# Patient Record
Sex: Female | Born: 1960 | Race: Black or African American | Hispanic: No | Marital: Single | Smoking: Never smoker
Health system: Southern US, Community
[De-identification: ages and names within clinical notes are randomized; demographics above are authoritative.]

## PROBLEM LIST (undated history)

## (undated) DIAGNOSIS — I10 Essential (primary) hypertension: Secondary | ICD-10-CM

## (undated) DIAGNOSIS — I4891 Unspecified atrial fibrillation: Secondary | ICD-10-CM

## (undated) DIAGNOSIS — N183 Chronic kidney disease, stage 3 unspecified: Secondary | ICD-10-CM

## (undated) HISTORY — PX: ICD IMPLANT: EP1208

## (undated) HISTORY — PX: ABLATION: SHX5711

---

## 2020-12-21 ENCOUNTER — Inpatient Hospital Stay
Admission: EM | Admit: 2020-12-21 | Discharge: 2020-12-23 | DRG: 281 | Disposition: A | Discharge status: Home / Self Care | Payer: BC Managed Care – PPO | Attending: Internal Medicine | Admitting: Internal Medicine

## 2020-12-21 ENCOUNTER — Other Ambulatory Visit: Payer: Self-pay

## 2020-12-21 ENCOUNTER — Encounter: Payer: Self-pay | Admitting: Emergency Medicine

## 2020-12-21 ENCOUNTER — Emergency Department: Payer: BC Managed Care – PPO

## 2020-12-21 DIAGNOSIS — E782 Mixed hyperlipidemia: Secondary | ICD-10-CM | POA: Diagnosis present

## 2020-12-21 DIAGNOSIS — Z95810 Presence of automatic (implantable) cardiac defibrillator: Secondary | ICD-10-CM

## 2020-12-21 DIAGNOSIS — I248 Other forms of acute ischemic heart disease: Secondary | ICD-10-CM | POA: Diagnosis not present

## 2020-12-21 DIAGNOSIS — I4891 Unspecified atrial fibrillation: Secondary | ICD-10-CM | POA: Diagnosis present

## 2020-12-21 DIAGNOSIS — I130 Hypertensive heart and chronic kidney disease with heart failure and stage 1 through stage 4 chronic kidney disease, or unspecified chronic kidney disease: Secondary | ICD-10-CM | POA: Diagnosis present

## 2020-12-21 DIAGNOSIS — I480 Paroxysmal atrial fibrillation: Secondary | ICD-10-CM | POA: Diagnosis present

## 2020-12-21 DIAGNOSIS — I5032 Chronic diastolic (congestive) heart failure: Secondary | ICD-10-CM | POA: Diagnosis present

## 2020-12-21 DIAGNOSIS — N183 Chronic kidney disease, stage 3 unspecified: Secondary | ICD-10-CM

## 2020-12-21 DIAGNOSIS — Z20822 Contact with and (suspected) exposure to covid-19: Secondary | ICD-10-CM | POA: Diagnosis present

## 2020-12-21 DIAGNOSIS — N1831 Chronic kidney disease, stage 3a: Secondary | ICD-10-CM | POA: Diagnosis present

## 2020-12-21 DIAGNOSIS — E876 Hypokalemia: Secondary | ICD-10-CM | POA: Diagnosis not present

## 2020-12-21 DIAGNOSIS — Z7901 Long term (current) use of anticoagulants: Secondary | ICD-10-CM | POA: Diagnosis not present

## 2020-12-21 DIAGNOSIS — R202 Paresthesia of skin: Secondary | ICD-10-CM | POA: Diagnosis present

## 2020-12-21 DIAGNOSIS — I422 Other hypertrophic cardiomyopathy: Secondary | ICD-10-CM | POA: Diagnosis present

## 2020-12-21 DIAGNOSIS — I214 Non-ST elevation (NSTEMI) myocardial infarction: Principal | ICD-10-CM | POA: Diagnosis present

## 2020-12-21 DIAGNOSIS — Z8249 Family history of ischemic heart disease and other diseases of the circulatory system: Secondary | ICD-10-CM

## 2020-12-21 DIAGNOSIS — N179 Acute kidney failure, unspecified: Secondary | ICD-10-CM | POA: Diagnosis present

## 2020-12-21 DIAGNOSIS — I10 Essential (primary) hypertension: Secondary | ICD-10-CM | POA: Diagnosis present

## 2020-12-21 DIAGNOSIS — R778 Other specified abnormalities of plasma proteins: Secondary | ICD-10-CM | POA: Diagnosis not present

## 2020-12-21 HISTORY — DX: Chronic kidney disease, stage 3 unspecified: N18.30

## 2020-12-21 HISTORY — DX: Unspecified atrial fibrillation: I48.91

## 2020-12-21 HISTORY — DX: Essential (primary) hypertension: I10

## 2020-12-21 LAB — BASIC METABOLIC PANEL
Anion gap: 10 (ref 5–15)
BUN: 18 mg/dL (ref 8–23)
CO2: 23 mmol/L (ref 22–32)
Calcium: 10.6 mg/dL — ABNORMAL HIGH (ref 8.9–10.3)
Chloride: 108 mmol/L (ref 98–111)
Creatinine, Ser: 1.55 mg/dL — ABNORMAL HIGH (ref 0.44–1.00)
GFR, Estimated: 38 mL/min — ABNORMAL LOW (ref >60)
Glucose, Bld: 156 mg/dL — ABNORMAL HIGH (ref 70–99)
Potassium: 3.1 mmol/L — ABNORMAL LOW (ref 3.5–5.1)
Sodium: 141 mmol/L (ref 135–145)

## 2020-12-21 LAB — CBC WITH DIFFERENTIAL/PLATELET
Abs Immature Granulocytes: 0 10*3/uL (ref 0.00–0.07)
Basophils Absolute: 0 10*3/uL (ref 0.0–0.1)
Basophils Relative: 1 %
Eosinophils Absolute: 0.2 10*3/uL (ref 0.0–0.5)
Eosinophils Relative: 5 %
HCT: 38.7 % (ref 36.0–46.0)
Hemoglobin: 12.9 g/dL (ref 12.0–15.0)
Immature Granulocytes: 0 %
Lymphocytes Relative: 45 %
Lymphs Abs: 1.7 10*3/uL (ref 0.7–4.0)
MCH: 28.2 pg (ref 26.0–34.0)
MCHC: 33.3 g/dL (ref 30.0–36.0)
MCV: 84.7 fL (ref 80.0–100.0)
Monocytes Absolute: 0.4 10*3/uL (ref 0.1–1.0)
Monocytes Relative: 9 %
Neutro Abs: 1.5 10*3/uL — ABNORMAL LOW (ref 1.7–7.7)
Neutrophils Relative %: 40 %
Platelets: 197 10*3/uL (ref 150–400)
RBC: 4.57 MIL/uL (ref 3.87–5.11)
RDW: 14.7 % (ref 11.5–15.5)
Smear Review: NORMAL
WBC Morphology: ABNORMAL
WBC: 3.7 10*3/uL — ABNORMAL LOW (ref 4.0–10.5)
nRBC: 0 % (ref 0.0–0.2)

## 2020-12-21 LAB — PROTIME-INR
INR: 1.2 (ref 0.8–1.2)
Prothrombin Time: 14.9 seconds (ref 11.4–15.2)

## 2020-12-21 LAB — RESP PANEL BY RT-PCR (FLU A&B, COVID) ARPGX2
Influenza A by PCR: NEGATIVE
Influenza B by PCR: NEGATIVE
SARS Coronavirus 2 by RT PCR: NEGATIVE

## 2020-12-21 LAB — BRAIN NATRIURETIC PEPTIDE: B Natriuretic Peptide: 786.4 pg/mL — ABNORMAL HIGH (ref 0.0–100.0)

## 2020-12-21 LAB — TROPONIN I (HIGH SENSITIVITY)
Troponin I (High Sensitivity): 56 ng/L — ABNORMAL HIGH (ref <18)
Troponin I (High Sensitivity): 764 ng/L (ref <18)

## 2020-12-21 LAB — APTT: aPTT: 22 seconds — ABNORMAL LOW (ref 24–36)

## 2020-12-21 MED ORDER — LABETALOL HCL 5 MG/ML IV SOLN: 10.0000 mg | INTRAVENOUS | Status: DC | PRN

## 2020-12-21 MED ORDER — ASCORBIC ACID 500 MG PO TABS
1000.0000 mg | ORAL_TABLET | Freq: Every day | ORAL | Status: DC
  Administered 2020-12-22 – 2020-12-23 (×2): 1000 mg via ORAL
  Filled 2020-12-21 (×2): qty 2

## 2020-12-21 MED ORDER — ONDANSETRON HCL 4 MG/2ML IJ SOLN
4.0000 mg | Freq: Four times a day (QID) | INTRAMUSCULAR | Status: DC | PRN
Start: 2020-12-21 — End: 2020-12-23

## 2020-12-21 MED ORDER — METOPROLOL SUCCINATE ER 50 MG PO TB24
50.0000 mg | ORAL_TABLET | Freq: Every day | ORAL | Status: DC
  Administered 2020-12-22 – 2020-12-23 (×2): 50 mg via ORAL
  Filled 2020-12-21 (×2): qty 1

## 2020-12-21 MED ORDER — SODIUM CHLORIDE 0.9 % IV SOLN
INTRAVENOUS | Status: DC
  Administered 2020-12-21: 175 mL/h via INTRAVENOUS

## 2020-12-21 MED ORDER — LOSARTAN POTASSIUM-HCTZ 100-12.5 MG PO TABS: 1.0000 | ORAL_TABLET | Freq: Every day | ORAL | Status: DC

## 2020-12-21 MED ORDER — VALACYCLOVIR HCL 500 MG PO TABS
1000.0000 mg | ORAL_TABLET | Freq: Every day | ORAL | Status: DC
  Filled 2020-12-21 (×2): qty 2

## 2020-12-21 MED ORDER — DILTIAZEM HCL 25 MG/5ML IV SOLN
5.0000 mg | Freq: Once | INTRAVENOUS | Status: AC
  Administered 2020-12-21: 5 mg via INTRAVENOUS
  Filled 2020-12-21: qty 5

## 2020-12-21 MED ORDER — SIMVASTATIN 20 MG PO TABS
40.0000 mg | ORAL_TABLET | Freq: Every day | ORAL | Status: DC
  Administered 2020-12-21 – 2020-12-22 (×2): 40 mg via ORAL
  Filled 2020-12-21: qty 2
  Filled 2020-12-21: qty 4

## 2020-12-21 MED ORDER — HEPARIN (PORCINE) 25000 UT/250ML-% IV SOLN
1000.0000 [IU]/h | INTRAVENOUS | Status: DC
  Administered 2020-12-21: 1000 [IU]/h via INTRAVENOUS
  Filled 2020-12-21: qty 250

## 2020-12-21 MED ORDER — LOSARTAN POTASSIUM 50 MG PO TABS
100.0000 mg | ORAL_TABLET | Freq: Every day | ORAL | Status: DC
  Administered 2020-12-21: 100 mg via ORAL
  Filled 2020-12-21: qty 2

## 2020-12-21 MED ORDER — PANTOPRAZOLE SODIUM 40 MG PO TBEC
80.0000 mg | DELAYED_RELEASE_TABLET | Freq: Every morning | ORAL | Status: DC
  Administered 2020-12-22 – 2020-12-23 (×2): 80 mg via ORAL
  Filled 2020-12-21 (×2): qty 2

## 2020-12-21 MED ORDER — DILTIAZEM HCL 25 MG/5ML IV SOLN: 10.0000 mg | Freq: Once | INTRAVENOUS | Status: DC

## 2020-12-21 MED ORDER — HYDROCHLOROTHIAZIDE 12.5 MG PO CAPS
12.5000 mg | ORAL_CAPSULE | Freq: Every day | ORAL | Status: DC
  Administered 2020-12-21: 12.5 mg via ORAL
  Filled 2020-12-21: qty 1

## 2020-12-21 MED ORDER — ACETAMINOPHEN 325 MG PO TABS: 650.0000 mg | ORAL_TABLET | ORAL | Status: DC | PRN

## 2020-12-21 MED ORDER — SODIUM CHLORIDE 0.9 % IV BOLUS
500.0000 mL | Freq: Once | INTRAVENOUS | Status: AC
Start: 2020-12-21 — End: 2020-12-21
  Administered 2020-12-21: 500 mL via INTRAVENOUS

## 2020-12-21 MED ORDER — HEPARIN BOLUS VIA INFUSION
4000.0000 [IU] | Freq: Once | INTRAVENOUS | Status: AC
  Administered 2020-12-21: 4000 [IU] via INTRAVENOUS
  Filled 2020-12-21: qty 4000

## 2020-12-21 MED ORDER — POTASSIUM CHLORIDE 10 MEQ/100ML IV SOLN
10.0000 meq | INTRAVENOUS | Status: AC
  Administered 2020-12-21 (×4): 10 meq via INTRAVENOUS
  Filled 2020-12-21 (×4): qty 100

## 2020-12-21 MED ORDER — COLCHICINE 0.6 MG PO TABS
0.6000 mg | ORAL_TABLET | Freq: Two times a day (BID) | ORAL | Status: DC
  Administered 2020-12-21 – 2020-12-23 (×4): 0.6 mg via ORAL
  Filled 2020-12-21 (×5): qty 1

## 2020-12-21 NOTE — ED Provider Notes (Signed)
[facility] Emergency Department Provider Note ____________________________________________   Event Date/Time   First MD Initiated Contact with Patient 12/21/20 1104     (approximate)  I have reviewed the triage vital signs and the nursing notes.   HISTORY  Chief Complaint Atrial Fibrillation    HPI Marie Nunez is a 60 y.o. female with PMH as noted below including atrial fibrillation on Eliquis (status post ablation earlier this year), hypertension, and ICD placement who presents with generalized weakness, acute onset around 10 AM after she came out of [place], persistent course, and associated with sweating and lightheadedness.  The patient states she feels slightly short of breath.  She denies any chest pain.  She has no nausea or vomiting.  The patient has had episodes of atrial fibrillation in the past with palpitations but is not really feeling any significant palpitations at this time.  She denies eating any large meals in the last few days or drinking alcohol.  She is visiting family here but lives in [place].  Past Medical History:  Diagnosis Date  . Atrial fibrillation (HCC)   . Hypertension     There are no problems to display for this patient.   Past Surgical History:  Procedure Laterality Date  . ABLATION    . ICD IMPLANT      Prior to Admission medications   Not on File    Allergies Ciprofloxacin and Iodine  History reviewed. No pertinent family history.  Social History Social History   Tobacco Use  . Smoking status: Never Smoker  . Smokeless tobacco: Never Used  Vaping Use  . Vaping Use: Never used  Substance Use Topics  . Alcohol use: Yes    Review of Systems  Constitutional: No fever.  Positive for weakness and lightheadedness. Eyes: No redness. ENT: No sore throat. Cardiovascular: Denies chest pain. Respiratory: Denies shortness of breath. Gastrointestinal: No vomiting or diarrhea.  Genitourinary:  Negative for flank pain. Musculoskeletal: Negative for back pain. Skin: Negative for rash. Neurological: Negative for headache.   ____________________________________________   PHYSICAL EXAM:  VITAL SIGNS: ED Triage Vitals  Enc Vitals Group     BP 12/21/20 1117 (!) 103/49     Pulse Rate 12/21/20 1117 (!) 138     Resp 12/21/20 1117 12     Temp 12/21/20 1117 98 F (36.7 C)     Temp Source 12/21/20 1117 Oral     SpO2 12/21/20 1117 100 %     Weight 12/21/20 1118 185 lb (83.9 kg)     Height 12/21/20 1118 5\' 5"  (1.651 m)     Head Circumference --      Peak Flow --      Pain Score 12/21/20 1117 0     Pain Loc --      Pain Edu? --      Excl. in GC? --     Constitutional: Alert and oriented.  Somewhat tired appearing and diaphoretic but in no acute distress. Eyes: Conjunctivae are normal.  Head: Atraumatic. Nose: No congestion/rhinnorhea. Mouth/Throat: Mucous membranes are moist.   Neck: Normal range of motion.  Cardiovascular: Tachycardic, irregular rhythm. Grossly normal heart sounds.  Good peripheral circulation. Respiratory: Normal respiratory effort.  No retractions. Lungs CTAB. Gastrointestinal: No distention.  Musculoskeletal: No lower extremity edema.  Extremities warm and well perfused.  Neurologic:  Normal speech and language. No gross focal neurologic deficits are appreciated.  Skin:  Skin is warm and dry. No rash noted. Psychiatric: Mood and affect are  normal. Speech and behavior are normal.  ____________________________________________   LABS (all labs ordered are listed, but only abnormal results are displayed)  Labs Reviewed  BASIC METABOLIC PANEL - Abnormal; Notable for the following components:      Result Value   Potassium 3.1 (*)    Glucose, Bld 156 (*)    Creatinine, Ser 1.55 (*)    Calcium 10.6 (*)    GFR, Estimated 38 (*)    All other components within normal limits  CBC WITH DIFFERENTIAL/PLATELET - Abnormal; Notable for the following  components:   WBC 3.7 (*)    Neutro Abs 1.5 (*)    All other components within normal limits  BRAIN NATRIURETIC PEPTIDE - Abnormal; Notable for the following components:   B Natriuretic Peptide 786.4 (*)    All other components within normal limits  TROPONIN I (HIGH SENSITIVITY) - Abnormal; Notable for the following components:   Troponin I (High Sensitivity) 56 (*)    All other components within normal limits  TROPONIN I (HIGH SENSITIVITY) - Abnormal; Notable for the following components:   Troponin I (High Sensitivity) 764 (*)    All other components within normal limits   ____________________________________________  EKG  ED ECG REPORT I, Dionne Bucy, the attending physician, personally viewed and interpreted this ECG.  Date: 12/21/2020 EKG Time: 1109 Rate: 150 Rhythm: Atrial fibrillation with RVR QRS Axis: normal Intervals: normal ST/T Wave abnormalities: Nonspecific ST abnormalities likely related Narrative Interpretation: Atrial fibrillation with RVR; no evidence of acute ischemia   ED ECG REPORT I, Dionne Bucy, the attending physician, personally viewed and interpreted this ECG.  Date: 12/21/2020 EKG Time: 1153 Rate: 60 Rhythm: normal sinus rhythm QRS Axis: normal Intervals: normal ST/T Wave abnormalities: ST depression, diffusely Narrative Interpretation: Nonspecific ST abnormalities with no prior EKG available for comparison   ED ECG REPORT I, Dionne Bucy, the attending physician, personally viewed and interpreted this ECG.  Date: 12/21/2020 EKG Time: 1534 Rate: 60 Rhythm: normal sinus rhythm QRS Axis: normal Intervals: normal ST/T Wave abnormalities: ST depressions inferior and lateral Narrative Interpretation: Nonspecific ST abnormalities change from EKG of 1153 today  ____________________________________________  RADIOLOGY  Chest x-ray interpreted by me shows cardiomegaly with no consolidation or  edema  ____________________________________________   PROCEDURES  Procedure(s) performed: No  Procedures  Critical Care performed: Yes  CRITICAL CARE Performed by: Dionne Bucy   Total critical care time: 30 minutes  Critical care time was exclusive of separately billable procedures and treating other patients.  Critical care was necessary to treat or prevent imminent or life-threatening deterioration.  Critical care was time spent personally by me on the following activities: development of treatment plan with patient and/or surrogate as well as nursing, discussions with consultants, evaluation of patient's response to treatment, examination of patient, obtaining history from patient or surrogate, ordering and performing treatments and interventions, ordering and review of laboratory studies, ordering and review of radiographic studies, pulse oximetry and re-evaluation of patient's condition. ____________________________________________   INITIAL IMPRESSION / ASSESSMENT AND PLAN / ED COURSE  Pertinent labs & imaging results that were available during my care of the patient were reviewed by me and considered in my medical decision making (see chart for details).  60 year old female with history of atrial fibrillation, hypertension, and ICD placement presents with acute onset of diaphoresis, generalized weakness, and lightheadedness this morning which has persisted since then.  She denies any chest pain or palpitations.  Per EMS, the patient was hypotensive in the field with a  systolic as low as the 60s.  On exam, the patient is somewhat tired appearing but in no acute distress.  She is alert and oriented.  Initial blood pressure was approximately 100/50, heart rate is in the 150s and EKG is showing atrial fibrillation with RVR.  The physical exam is otherwise unremarkable.  The lungs are clear.  There is no significant peripheral edema.  Overall presentation is consistent  with paroxysmal atrial fibrillation.  I attempted to review past medical records, however the patient follows in [place] and I do not see any of her records available in care everywhere.  She has not been here previously.  Per discussion with the patient, she does not have a diagnosis of CHF.  Given the improved blood pressure, we will treat with IV Cardizem.  I will consider cardioversion if the patient has recurrent hypotension or other signs of instability.  We will obtain basic and cardiac labs, chest x-ray, and continue to monitor closely.  ----------------------------------------- 11:57 AM on 12/21/2020 -----------------------------------------  Patient has converted to normal sinus at a rate of 60.  ----------------------------------------- 3:49 PM on 12/21/2020 -----------------------------------------  Initial troponin was minimally elevated.  I discussed the case with Dr. Ladona Ridgel from cardiology and the initial plan was to likely discharge home.  However, repeat troponin is significantly elevated.  The patient has no chest pain and has been asymptomatic since converting to sinus rhythm.  Her rate has been 60 since she converted.  Repeat EKG now shows ST depressions in the inferior and lateral leads which are changed from the prior EKG.  This is somewhat concerning for ischemia although the patient is asymptomatic.  I consulted Dr. Ladona Ridgel again and he recommends starting the patient on a heparin infusion and agrees with admission.  We will contact hospitalist.    ____________________________________________   FINAL CLINICAL IMPRESSION(S) / ED DIAGNOSES  Final diagnoses:  Paroxysmal atrial fibrillation (HCC)  NSTEMI (non-ST elevated myocardial infarction) (HCC)      NEW MEDICATIONS STARTED DURING THIS VISIT:  New Prescriptions   No medications on file     Note:  This document was prepared using Dragon voice recognition software and may include unintentional dictation  errors.   Dionne Bucy, MD 12/21/20 [phone]

## 2020-12-21 NOTE — Consult Note (Signed)
ANTICOAGULATION CONSULT NOTE - Initial Consult  Pharmacy Consult for heparin Indication: chest pain/ACS  Allergies  Allergen Reactions  . Ciprofloxacin   . Iodine Rash    Patient Measurements: Height: 5\' 5"  (165.1 cm) Weight: 83.9 kg (185 lb) IBW/kg (Calculated) : 57 Heparin Dosing Weight: 75  Vital Signs: Temp: 98 F (36.7 C) (06/05 1117) Temp Source: Oral (06/05 1117) BP: 140/90 (06/05 1515) Pulse Rate: 58 (06/05 1515)  Labs: Recent Labs    12/21/20 1125 12/21/20 1442  HGB 12.9  --   HCT 38.7  --   PLT 197  --   CREATININE 1.55*  --   TROPONINIHS 56* 764*    Estimated Creatinine Clearance: 40.3 mL/min (A) (by C-G formula based on SCr of 1.55 mg/dL (H)).   Medical History: Past Medical History:  Diagnosis Date  . Atrial fibrillation (HCC)   . Hypertension     Medications:  No anticoagulants prior to admission per chart review   Assessment: 60 yo F presenting with weakness and diaphoresis. Pt found to be in afib. Pt had ablation 2 weeks ago. PMH includes ICD and pacemaker placed in 2020, afib, and HTN. Pharmacy has been consulted to dose and monitor heparin for ACS/STEMI.   Hgb 12.9, plt 197, trops 56>>764  Goal of Therapy:  Heparin level 0.3-0.7 units/ml Monitor platelets by anticoagulation protocol: Yes   Plan:  Give 4000 units bolus x 1 Start heparin infusion at 1000 units/hr Check anti-Xa level in 6 hours and daily while on heparin Continue to monitor H&H and platelets  2021, PharmD Pharmacy Resident  12/21/2020 3:59 PM

## 2020-12-21 NOTE — ED Notes (Signed)
This RN at bedside. Pt NSR and HR in 60s. EKG printed. MD made aware.

## 2020-12-21 NOTE — H&P (Signed)
History and Physical   Marie Nunez CBS:[id number] DOB: July 10, 1961 DOA: 12/21/2020  PCP: Dr. Wilford Grist, [facility]ists: Dr. Jannette Fogo, [facility] Patient coming from: [place] via EMS  I have personally briefly reviewed patient's old medical records in [facility] EMR.  Chief Concern: Diaphoresis  HPI: Marie Nunez is a 60 y.o. female with medical history significant for atrial fibrillation s/p atrial ablation on 12/03/20, GERD, hypertension, hyperlipidemia, hypertrophic cardiomyopathy, stage IIIa CKD, presents to the emergency department for chief concerns of diaphoresis.  She reports that she experienced diaphoresis and nausea that started approximately 10:02 AM while outside the parking lot of [place]. She hasn't eaten anything yet. She has not had food today. She has had two sips of water only.  She reports that she felt this way in January 2022, when she was shopping.  She went home and then she called [facility] and was told to go to the ED, where she was monitored in the ED for several hours and sent home.  She states that she was in the ED for approximately 5 hours.  She also reports that the ED provider discussed with her cardiologist at that time and recommended that she be discharged home.  She states that during both times, she denies arm pain, jaw discomfort, neck discomfort, abnormal taste in her mouth.   She endorsed initial left finger tingling and then right sided tingling today while she was in the ED.  She is visiting from [place].   Social history: She lives by herself at home in [place]. She denies tobacco, etoh, recreational drug use. 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  Vaccinations: She has received three doses of COVID-19 vaccination, Moderna.  Family medical history: older brother had a MI at age 12 and father had a MI in his 33s. Younger sister and younger brother had hypertension  and hyperlipidemia  ROS: Constitutional: no weight change, no fever, + diaphoresis ENT/Mouth: no sore throat, no rhinorrhea Eyes: no eye pain, no vision changes Cardiovascular: no chest pain, no dyspnea,  no edema, no palpitations Respiratory: no cough, no sputum, no wheezing Gastrointestinal: + nausea, no vomiting, no diarrhea, no constipation Genitourinary: no urinary incontinence, no dysuria, no hematuria Musculoskeletal: no arthralgias, no myalgias Skin: no skin lesions, no pruritus, Neuro: + weakness, no loss of consciousness, no syncope Psych: no anxiety, no depression, + decrease appetite Heme/Lymph: no bruising, no bleeding  ED Course: Discussed with ED provider, patient requiring hospitalization for NSTEMI.  Initial vitals in the emergency department showed temperature of 98, respiration rate of 17, heart rate 58, blood pressure 140/90, SPO2 of 100% on room air.  Patient was initially in atrial fibrillation with RVR and improved with diltiazem 5 mg IV per EDP.  Patient was started on heparin GTT with bolus, and received normal saline 500 ml bolus.  Assessment/Plan  Principal Problem:   NSTEMI (non-ST elevated myocardial infarction) (HCC) Active Problems:   Essential hypertension   Hyperlipidemia, mixed   CKD (chronic kidney disease) stage 3, GFR 30-59 ml/min (HCC)   AKI (acute kidney injury) (HCC)   NSTEMI- ACS cannot be excluded at this time -Patient has multiple risk factors including hypertension, family genetics -Cardiology, Dr. Ladona Ridgel has been consulted and recommends admission - Status post heparin bolus and GTT per EDP, this will be continued  Acute kidney injury on CKD stage IIIa -Serum creatinine on presentation is 1.55 with a GFR of 38 -Baseline creatinine is 1.1 - 1.37 from care  everywhere in 2022 -Status post normal saline bolus per EDP -Normal saline 125 mL/h, 1 day ordered -Holding home hydrochlorothiazide and losartan at this time -BMP in the  a.m.  Hypertension- resumed metoprolol succinate 50 mg daily -Labetalol 10 mg IV every 2 hours for elevated blood pressure, 2 doses ordered  Hypertrophic cardiomyopathy-outpatient follow-up with cardiology -Resumed metoprolol succinate 50 mg daily  Atrial fibrillation-status post atrial ablation on 12/03/2020 -Holding home Eliquis at this time - Heparin GTT -Resumed metoprolol succinate 50 mg daily  Hyperlipidemia-simvastatin 40 mg nightly resumed  History of myocarditis/pericarditis-resumed colchicine 0.6 mg p.o. twice daily  Chart reviewed.   Complete echocardiogram on 09/08/2020: Left ventricular ejection fraction was estimated at 55%, left ventricular chamber is normal size, concentric left ventricular hypertrophy with a severely thickened septal wall and mildly thickened posterior wall, severe hypertrophy mid to apex.  Grade 1 diastolic dysfunction.  Estimated pulmonary arterial systolic pressure is 24 mmHg.  DVT prophylaxis: heparin gtt  Code Status: full code  Diet: N.p.o. Family Communication: Neva Seat, older brother was updated at bedside Disposition Plan: Pending clinical course and cardiology evaluation Consults called: Cardiology via EDP Admission status: Inpatient, progressive cardiac, telemetry ordered for 24 hours  Past Medical History:  Diagnosis Date  . Atrial fibrillation (HCC)   . CKD (chronic kidney disease), stage III (HCC)   . Hypertension    Past Surgical History:  Procedure Laterality Date  . ABLATION    . ICD IMPLANT     Social History:  reports that she has never smoked. She has never used smokeless tobacco. She reports current alcohol use. No history on file for drug use.  Allergies  Allergen Reactions  . Ciprofloxacin   . Codeine   . Tape     Pt gets like a burn from the adhesive  . Iodine Rash   Family History  Problem Relation Age of Onset  . Heart disease Father   . Heart attack Brother    Family history: Family history reviewed  and pertinent for MI in father in his 42s, and MI in brother in his 37s.  Hypertension and high cholesterol in younger brother and sister.  Prior home medication: Apixaban 5 mg p.o. twice daily, vitamin C, colchicine 0.6 mg p.o. twice daily, vitamin B-12, vitamin D, esomeprazole 40 mg p.o. twice daily, losartan-hydrochlorothiazide 100-12.5 mg daily, metoprolol succinate 50 mg daily, simvastatin 40 mg p.o. daily, valacyclovir 1 g tablet with no dose or frequency record  Physical Exam: Vitals:   12/21/20 1615 12/21/20 1645 12/21/20 1700 12/21/20 1830  BP: 133/87 (!) 131/96 116/87   Pulse:  60 (!) 58 60  Resp: 17 15 14 17   Temp:      TempSrc:      SpO2: 100% 100% 99% 100%  Weight:      Height:       Constitutional: appears age-appropriate, NAD, calm, comfortable Eyes: PERRL, lids and conjunctivae normal ENMT: Mucous membranes are moist. Posterior pharynx clear of any exudate or lesions. Age-appropriate dentition. Hearing appropriate Neck: normal, supple, no masses, no thyromegaly Respiratory: clear to auscultation bilaterally, no wheezing, no crackles. Normal respiratory effort. No accessory muscle use.  Cardiovascular: Regular rate and rhythm, no murmurs / rubs / gallops. No extremity edema. 2+ pedal pulses. No carotid bruits.  Abdomen: no tenderness, no masses palpated, no hepatosplenomegaly. Bowel sounds positive.  Musculoskeletal: no clubbing / cyanosis. No joint deformity upper and lower extremities. Good ROM, no contractures, no atrophy. Normal muscle tone.  Skin: no rashes, lesions,  ulcers. No induration Neurologic: Sensation intact. Strength 5/5 in all 4.  Psychiatric: Normal judgment and insight. Alert and oriented x 3. Normal mood.   EKG: independently reviewed, showing issue EKG showed atrial fibrillation with rate of 150, QTc 493.  Repeat EKG showed ST depression in leads II, 3, V4 to V5  Chest x-ray on Admission: I personally reviewed and I agree with radiologist reading as  below.  DG Chest Portable 1 View  Result Date: 12/21/2020 CLINICAL DATA:  Atrial fibrillation EXAM: PORTABLE CHEST 1 VIEW COMPARISON:  None. FINDINGS: Lungs are clear. Heart is enlarged with pulmonary vascularity normal. Pacemaker leads are attached to the right atrium and right ventricle. No adenopathy. No pneumothorax. No bone lesions. IMPRESSION: Cardiomegaly with pacemaker leads attached to right atrium and right ventricle. Lungs clear. Electronically Signed   By: Bretta Bang III M.D.   On: 12/21/2020 11:35   Labs on Admission: I have personally reviewed following labs  CBC: Recent Labs  Lab 12/21/20 1125  WBC 3.7*  NEUTROABS 1.5*  HGB 12.9  HCT 38.7  MCV 84.7  PLT 197   Basic Metabolic Panel: Recent Labs  Lab 12/21/20 1125  NA 141  K 3.1*  CL 108  CO2 23  GLUCOSE 156*  BUN 18  CREATININE 1.55*  CALCIUM 10.6*   GFR: Estimated Creatinine Clearance: 40.3 mL/min (A) (by C-G formula based on SCr of 1.55 mg/dL (H)).  Coagulation Profile: Recent Labs  Lab 12/21/20 1623  INR 1.2   Sharnetta Gielow N Ginnette Gates D.O. [facility]  If 7PM-7AM, please contact overnight-coverage provider If 7AM-7PM, please contact day coverage provider [email or web address]  12/21/2020, 7:13 PM

## 2020-12-21 NOTE — ED Triage Notes (Addendum)
Pt via EMS from home. Pt states all of sudden she felt weak and became diaphoretic. Pt had an ablation 2 weeks ago. Pt does have and ICD and pacemaker implanted in 2020. Denies pain. EMS reports initial BP of 60s. Repeat was 75/30. Pt is A&Ox4 and NAD.

## 2020-12-21 NOTE — ED Notes (Signed)
Critical value called from lab - Troponin 764. MD Siadecki made aware

## 2020-12-22 ENCOUNTER — Inpatient Hospital Stay (HOSPITAL_COMMUNITY)
Admit: 2020-12-22 | Discharge: 2020-12-22 | Disposition: A | Discharge status: Home / Self Care | Payer: BC Managed Care – PPO | Attending: Physician Assistant | Admitting: Physician Assistant

## 2020-12-22 DIAGNOSIS — I480 Paroxysmal atrial fibrillation: Secondary | ICD-10-CM

## 2020-12-22 DIAGNOSIS — I422 Other hypertrophic cardiomyopathy: Secondary | ICD-10-CM

## 2020-12-22 DIAGNOSIS — I248 Other forms of acute ischemic heart disease: Secondary | ICD-10-CM

## 2020-12-22 DIAGNOSIS — R778 Other specified abnormalities of plasma proteins: Secondary | ICD-10-CM

## 2020-12-22 DIAGNOSIS — I4891 Unspecified atrial fibrillation: Secondary | ICD-10-CM | POA: Diagnosis present

## 2020-12-22 DIAGNOSIS — I214 Non-ST elevation (NSTEMI) myocardial infarction: Secondary | ICD-10-CM

## 2020-12-22 LAB — PROTIME-INR
INR: 1.1 (ref 0.8–1.2)
Prothrombin Time: 14.3 seconds (ref 11.4–15.2)

## 2020-12-22 LAB — BASIC METABOLIC PANEL
Anion gap: 7 (ref 5–15)
BUN: 14 mg/dL (ref 8–23)
CO2: 25 mmol/L (ref 22–32)
Calcium: 10.6 mg/dL — ABNORMAL HIGH (ref 8.9–10.3)
Chloride: 108 mmol/L (ref 98–111)
Creatinine, Ser: 1.15 mg/dL — ABNORMAL HIGH (ref 0.44–1.00)
GFR, Estimated: 54 mL/min — ABNORMAL LOW (ref >60)
Glucose, Bld: 92 mg/dL (ref 70–99)
Potassium: 3.3 mmol/L — ABNORMAL LOW (ref 3.5–5.1)
Sodium: 140 mmol/L (ref 135–145)

## 2020-12-22 LAB — ECHOCARDIOGRAM COMPLETE
AR max vel: 2.88 cm2
AV Area VTI: 3.13 cm2
AV Area mean vel: 2.8 cm2
AV Mean grad: 5 mmHg
AV Peak grad: 8.8 mmHg
Ao pk vel: 1.48 m/s
Area-P 1/2: 4.68 cm2
Height: 65 in
S' Lateral: 1.93 cm
Weight: 2960 oz

## 2020-12-22 LAB — TROPONIN I (HIGH SENSITIVITY)
Troponin I (High Sensitivity): 1458 ng/L (ref <18)
Troponin I (High Sensitivity): 1288 ng/L (ref <18)

## 2020-12-22 LAB — CBC
HCT: 35.1 % — ABNORMAL LOW (ref 36.0–46.0)
Hemoglobin: 11.6 g/dL — ABNORMAL LOW (ref 12.0–15.0)
MCH: 28.2 pg (ref 26.0–34.0)
MCHC: 33 g/dL (ref 30.0–36.0)
MCV: 85.4 fL (ref 80.0–100.0)
Platelets: 170 10*3/uL (ref 150–400)
RBC: 4.11 MIL/uL (ref 3.87–5.11)
RDW: 14.7 % (ref 11.5–15.5)
WBC: 4.7 10*3/uL (ref 4.0–10.5)
nRBC: 0 % (ref 0.0–0.2)

## 2020-12-22 LAB — HEPARIN LEVEL (UNFRACTIONATED)
Heparin Unfractionated: >1.1 IU/mL — ABNORMAL HIGH (ref 0.30–0.70)
Heparin Unfractionated: 0.98 IU/mL — ABNORMAL HIGH (ref 0.30–0.70)

## 2020-12-22 LAB — APTT
aPTT: 69 seconds — ABNORMAL HIGH (ref 24–36)
aPTT: 60 seconds — ABNORMAL HIGH (ref 24–36)

## 2020-12-22 MED ORDER — HEPARIN (PORCINE) 25000 UT/250ML-% IV SOLN
900.0000 [IU]/h | INTRAVENOUS | Status: DC
  Administered 2020-12-22: 800 [IU]/h via INTRAVENOUS

## 2020-12-22 MED ORDER — HYDRALAZINE HCL 20 MG/ML IJ SOLN: 10.0000 mg | INTRAMUSCULAR | Status: DC | PRN

## 2020-12-22 MED ORDER — LOSARTAN POTASSIUM-HCTZ 100-12.5 MG PO TABS: 1.0000 | ORAL_TABLET | Freq: Every day | ORAL | Status: DC

## 2020-12-22 MED ORDER — HYDROCHLOROTHIAZIDE 12.5 MG PO CAPS
12.5000 mg | ORAL_CAPSULE | Freq: Every day | ORAL | Status: DC
  Administered 2020-12-22 – 2020-12-23 (×2): 12.5 mg via ORAL
  Filled 2020-12-22 (×2): qty 1

## 2020-12-22 MED ORDER — POTASSIUM CHLORIDE CRYS ER 20 MEQ PO TBCR
40.0000 meq | EXTENDED_RELEASE_TABLET | Freq: Two times a day (BID) | ORAL | Status: AC
  Administered 2020-12-22 (×2): 40 meq via ORAL
  Filled 2020-12-22 (×2): qty 2

## 2020-12-22 MED ORDER — LOSARTAN POTASSIUM 50 MG PO TABS
100.0000 mg | ORAL_TABLET | Freq: Every day | ORAL | Status: DC
  Administered 2020-12-22 – 2020-12-23 (×2): 100 mg via ORAL
  Filled 2020-12-22 (×2): qty 2

## 2020-12-22 MED ORDER — APIXABAN 5 MG PO TABS
5.0000 mg | ORAL_TABLET | Freq: Two times a day (BID) | ORAL | Status: DC
  Administered 2020-12-22 – 2020-12-23 (×3): 5 mg via ORAL
  Filled 2020-12-22 (×3): qty 1

## 2020-12-22 MED ORDER — VALACYCLOVIR HCL 500 MG PO TABS
1000.0000 mg | ORAL_TABLET | ORAL | Status: DC | PRN
  Filled 2020-12-22: qty 2

## 2020-12-22 NOTE — ED Notes (Signed)
Sent msg to Dr Georgeann Oppenheim to clarify BP parameters for IV labetalol PRN. Informed of recent BP trends.

## 2020-12-22 NOTE — Progress Notes (Signed)
*  PRELIMINARY RESULTS* Echocardiogram 2D Echocardiogram has been performed.  Cristela Blue 12/22/2020, 11:43 AM

## 2020-12-22 NOTE — ED Notes (Signed)
Spoke with attending and PA from cardiology team. Pt will have echocardiogram first before deciding whether to do cardiac cath. If pt does have cardiac cath, it would be tomorrow. Therefore, pt is not NPO for now. Explained to pt and provided breakfast tray.

## 2020-12-22 NOTE — ED Notes (Signed)
Pt ambulatory to the restroom with no assistance.  

## 2020-12-22 NOTE — Consult Note (Signed)
ANTICOAGULATION CONSULT NOTE   Pharmacy Consult for heparin Indication: chest pain/ACS  Allergies  Allergen Reactions  . Ciprofloxacin   . Codeine   . Tape     Pt gets like a burn from the adhesive  . Iodine Rash    Patient Measurements: Height: 5\' 5"  (165.1 cm) Weight: 83.9 kg (185 lb) IBW/kg (Calculated) : 57 Heparin Dosing Weight: 75  Vital Signs: BP: 163/108 (06/06 0105) Pulse Rate: 64 (06/06 0105)  Labs: Recent Labs    12/21/20 1125 12/21/20 1442 12/21/20 1623 12/22/20 0104  HGB 12.9  --   --   --   HCT 38.7  --   --   --   PLT 197  --   --   --   APTT  --   --  22*  --   LABPROT  --   --  14.9  --   INR  --   --  1.2  --   HEPARINUNFRC  --   --   --  >1.10*  CREATININE 1.55*  --   --   --   TROPONINIHS 56* 764*  --   --     Estimated Creatinine Clearance: 40.3 mL/min (A) (by C-G formula based on SCr of 1.55 mg/dL (H)).   Medical History: Past Medical History:  Diagnosis Date  . Atrial fibrillation (HCC)   . CKD (chronic kidney disease), stage III (HCC)   . Hypertension     Medications:  No anticoagulants prior to admission per chart review   Assessment: 60 yo F presenting with weakness and diaphoresis. Pt found to be in afib. Pt had ablation 2 weeks ago. PMH includes ICD and pacemaker placed in 2020, afib, and HTN. Pharmacy has been consulted to dose and monitor heparin for ACS/STEMI.   Hgb 12.9, plt 197, trops 56>>764  Goal of Therapy:  Heparin level 0.3-0.7 units/ml Monitor platelets by anticoagulation protocol: Yes   6/05 0104 HL >1.10, supratherapeutic   Plan:  Contacted RN to hold infusion for 1 hour. Will restart heparin infusion at rate of 800 units/hr Recheck HL 6 hrs after restart CBC daily while on heparin.  8/05, PharmD, MBA 12/22/2020 2:21 AM

## 2020-12-22 NOTE — Progress Notes (Signed)
PROGRESS NOTE    Marie Nunez  INO:[id number] DOB: Feb 13, 1961 DOA: 12/21/2020 PCP: Pcp, No    Brief Narrative:  60 y.o. female with medical history significant for atrial fibrillation s/p atrial ablation on 12/03/20, GERD, hypertension, hyperlipidemia, hypertrophic cardiomyopathy, stage IIIa CKD, presents to the emergency department for chief concerns of diaphoresis.  She reports that she experienced diaphoresis and nausea that started approximately 10:02 AM while outside the parking lot of [employer]. She hasn't eaten anything yet. She has not had food today. She has had two sips of water only.  She reports that she felt this way in January 2022, when she was shopping.  She went home and then she called [facility] and was told to go to the ED, where she was monitored in the ED for several hours and sent home.  She states that she was in the ED for approximately 5 hours.  She also reports that the ED provider discussed with her cardiologist at that time and recommended that she be discharged home.  She states that during both times, she denies arm pain, jaw discomfort, neck discomfort, abnormal taste in her mouth.   She endorsed initial left finger tingling and then right sided tingling today while she was in the ED.  Troponins were elevated to 700 and subsequently 1400.  Cardiology was consulted after initiation of heparin GTT for presumed NSTEMI.  Case discussed with cardiology.  EKG and echocardiogram reviewed.  Initially was in atrial fibrillation however rate controlled after IV Cardizem push.  Elevated troponin likely some demand ischemia.  No plans for ischemic evaluation during this admission.   Assessment & Plan:   Principal Problem:   NSTEMI (non-ST elevated myocardial infarction) (HCC) Active Problems:   Essential hypertension   Hyperlipidemia, mixed   CKD (chronic kidney disease) stage 3, GFR 30-59 ml/min (HCC)   AKI (acute kidney injury) (HCC)  Atrial fibrillation  with rapid ventricular response Patient status post atrial ablation 5/18 Presented after palpitations and diaphoresis while at [employer] [place] Converted to sinus rhythm after 10 mg push of IV Cardizem Per cardiology not indicative of ACS Plan: Can DC heparin GTT Resume home Eliquis 5 mg twice daily Resume Toprol-XL 50 mg daily Monitor on telemetry overnight for recurrence of arrhythmia If patient remained stable anticipate discharge home 6/7  Elevated troponin Per cardiology not representative of ACS Likely supply demand ischemia in the setting of apical hypertrophy and A. fib RVR Similar presentation in [place] in 2020 Clean cath at that time No plans for ischemic evaluation during this admission Plan: Monitor on telemetry overnight Anticipated discharge in a.m.  Acute kidney injury on chronic disease stage IIIa Creatinine on presentation 1.55 Baseline is 1.1-1.37 Received fluid resuscitation in ED  Hypertension resumed metoprolol succinate 50 mg daily Resume lisinopril hydrochlorothiazide As needed IV hydralazine  Hypertrophic cardiomyopathy outpatient follow-up with cardiology -Resumed metoprolol succinate 50 mg daily  Hyperlipidemia simvastatin 40 mg nightly resumed  History of myocarditis/pericarditis resumed colchicine 0.6 mg p.o. twice daily   DVT prophylaxis: Apixaban Code Status: Full Family Communication: None today Disposition Plan: Status is: Inpatient  Remains inpatient appropriate because:Inpatient level of care appropriate due to severity of illness   Dispo: The patient is from: Home              Anticipated d/c is to: Home              Patient currently is not medically stable to d/c.   Difficult to place patient No  A. fib  with RVR.  Elevated troponin felt to be due to supply demand ischemia.  No plans for inpatient ischemic evaluation.  Will monitor on telemetry with plan discharge on 6/7     Level of care: Progressive  Cardiac  Consultants:   Cardiology-[facility]  Procedures:   None  Antimicrobials:   None   Subjective: Seen and examined.  Resting comfortably in bed.  No visible distress.  No pain complaints  Objective: Vitals:   12/22/20 1030 12/22/20 1100 12/22/20 1130 12/22/20 1230  BP: (!) 139/100  133/71 (!) 136/103  Pulse: 80 62 65 (!) 59  Resp: 16 13 17 15   Temp:      TempSrc:      SpO2: 99% 99% 100% 97%  Weight:      Height:       No intake or output data in the 24 hours ending 12/22/20 1339 Filed Weights   12/21/20 1118  Weight: 83.9 kg    Examination:  General exam: Appears calm and comfortable  Respiratory system: Clear to auscultation. Respiratory effort normal. Cardiovascular system: S1 & S2 heard, RRR. No JVD, murmurs, rubs, gallops or clicks. No pedal edema. Gastrointestinal system: Abdomen is nondistended, soft and nontender. No organomegaly or masses felt. Normal bowel sounds heard. Central nervous system: Alert and oriented. No focal neurological deficits. Extremities: Symmetric 5 x 5 power. Skin: No rashes, lesions or ulcers Psychiatry: Judgement and insight appear normal. Mood & affect appropriate.     Data Reviewed: I have personally reviewed following labs and imaging studies  CBC: Recent Labs  Lab 12/21/20 1125 12/22/20 0402  WBC 3.7* 4.7  NEUTROABS 1.5*  --   HGB 12.9 11.6*  HCT 38.7 35.1*  MCV 84.7 85.4  PLT 197 170   Basic Metabolic Panel: Recent Labs  Lab 12/21/20 1125 12/22/20 0950  NA 141 140  K 3.1* 3.3*  CL 108 108  CO2 23 25  GLUCOSE 156* 92  BUN 18 14  CREATININE 1.55* 1.15*  CALCIUM 10.6* 10.6*   GFR: Estimated Creatinine Clearance: 54.3 mL/min (A) (by C-G formula based on SCr of 1.15 mg/dL (H)). Liver Function Tests: No results for input(s): AST, ALT, ALKPHOS, BILITOT, PROT, ALBUMIN in the last 168 hours. No results for input(s): LIPASE, AMYLASE in the last 168 hours. No results for input(s): AMMONIA in the last 168  hours. Coagulation Profile: Recent Labs  Lab 12/21/20 1623 12/22/20 0402  INR 1.2 1.1   Cardiac Enzymes: No results for input(s): CKTOTAL, CKMB, CKMBINDEX, TROPONINI in the last 168 hours. BNP (last 3 results) No results for input(s): PROBNP in the last 8760 hours. HbA1C: No results for input(s): HGBA1C in the last 72 hours. CBG: No results for input(s): GLUCAP in the last 168 hours. Lipid Profile: No results for input(s): CHOL, HDL, LDLCALC, TRIG, CHOLHDL, LDLDIRECT in the last 72 hours. Thyroid Function Tests: No results for input(s): TSH, T4TOTAL, FREET4, T3FREE, THYROIDAB in the last 72 hours. Anemia Panel: No results for input(s): VITAMINB12, FOLATE, FERRITIN, TIBC, IRON, RETICCTPCT in the last 72 hours. Sepsis Labs: No results for input(s): PROCALCITON, LATICACIDVEN in the last 168 hours.  Recent Results (from the past 240 hour(s))  Resp Panel by RT-PCR (Flu A&B, Covid) Nasopharyngeal Swab     Status: None   Collection Time: 12/21/20  9:49 PM   Specimen: Nasopharyngeal Swab; Nasopharyngeal(NP) swabs in vial transport medium  Result Value Ref Range Status   SARS Coronavirus 2 by RT PCR NEGATIVE NEGATIVE Final    Comment: (NOTE) SARS-CoV-2  target nucleic acids are NOT DETECTED.  The SARS-CoV-2 RNA is generally detectable in upper respiratory specimens during the acute phase of infection. The lowest concentration of SARS-CoV-2 viral copies this assay can detect is 138 copies/mL. A negative result does not preclude SARS-Cov-2 infection and should not be used as the sole basis for treatment or other patient management decisions. A negative result may occur with  improper specimen collection/handling, submission of specimen other than nasopharyngeal swab, presence of viral mutation(s) within the areas targeted by this assay, and inadequate number of viral copies(<138 copies/mL). A negative result must be combined with clinical observations, patient history, and  epidemiological information. The expected result is Negative.  Fact Sheet for Patients:  BloggerCourse.com  Fact Sheet for Healthcare Providers:  SeriousBroker.it  This test is no t yet approved or cleared by the [place] FDA and  has been authorized for detection and/or diagnosis of SARS-CoV-2 by FDA under an Emergency Use Authorization (EUA). This EUA will remain  in effect (meaning this test can be used) for the duration of the COVID-19 declaration under Section 564(b)(1) of the Act, 21 U.S.C.section 360bbb-3(b)(1), unless the authorization is terminated  or revoked sooner.       Influenza A by PCR NEGATIVE NEGATIVE Final   Influenza B by PCR NEGATIVE NEGATIVE Final    Comment: (NOTE) The Xpert Xpress SARS-CoV-2/FLU/RSV plus assay is intended as an aid in the diagnosis of influenza from Nasopharyngeal swab specimens and should not be used as a sole basis for treatment. Nasal washings and aspirates are unacceptable for Xpert Xpress SARS-CoV-2/FLU/RSV testing.  Fact Sheet for Patients: BloggerCourse.com  Fact Sheet for Healthcare Providers: SeriousBroker.it  This test is not yet approved or cleared by the [place] FDA and has been authorized for detection and/or diagnosis of SARS-CoV-2 by FDA under an Emergency Use Authorization (EUA). This EUA will remain in effect (meaning this test can be used) for the duration of the COVID-19 declaration under Section 564(b)(1) of the Act, 21 U.S.C. section 360bbb-3(b)(1), unless the authorization is terminated or revoked.  Performed at [facility], [address], [place], [place] [address]          Radiology Studies: DG Chest Portable 1 View  Result Date: 12/21/2020 CLINICAL DATA:  Atrial fibrillation EXAM: PORTABLE CHEST 1 VIEW COMPARISON:  None. FINDINGS: Lungs are clear. Heart is enlarged with pulmonary  vascularity normal. Pacemaker leads are attached to the right atrium and right ventricle. No adenopathy. No pneumothorax. No bone lesions. IMPRESSION: Cardiomegaly with pacemaker leads attached to right atrium and right ventricle. Lungs clear. Electronically Signed   By: Bretta Bang III M.D.   On: 12/21/2020 11:35   ECHOCARDIOGRAM COMPLETE  Result Date: 12/22/2020    ECHOCARDIOGRAM REPORT   Patient Name:   Marie Nunez Date of Exam: 12/22/2020 Medical Rec #:  [id number]     Height:       65.0 in Accession #:    [id number]    Weight:       185.0 lb Date of Birth:  Dec 13, 1960      BSA:          1.914 m Patient Age:    60 years      BP:           144/93 mmHg Patient Gender: F             HR:           645 bpm. Exam Location:  [facility] Procedure: 2D  Echo, Cardiac Doppler and Color Doppler Indications:     Elevated Troponin  History:         Patient has no prior history of Echocardiogram examinations.                  Defibrillator, Arrythmias:Atrial Fibrillation; Risk                  Factors:Hypertension.  Sonographer:     Cristela Blue RDCS (AE) Referring Phys:  [id number] Lennon Alstrom Diagnosing Phys: Lorine Bears MD  Sonographer Comments: Suboptimal apical window. IMPRESSIONS  1. Left ventricular ejection fraction, by estimation, is 65 to 70%. The left ventricle has normal function. The left ventricle has no regional wall motion abnormalities. There is moderate left ventricular hypertrophy. Left ventricular diastolic parameters are indeterminate.  2. Right ventricular systolic function is normal. The right ventricular size is normal. Tricuspid regurgitation signal is inadequate for assessing PA pressure.  3. Left atrial size was moderately dilated.  4. The mitral valve is normal in structure. Trivial mitral valve regurgitation. No evidence of mitral stenosis.  5. The aortic valve is normal in structure. Aortic valve regurgitation is not visualized. No aortic stenosis is present.  6. Prominent apical hypertrophy.  FINDINGS  Left Ventricle: Left ventricular ejection fraction, by estimation, is 65 to 70%. The left ventricle has normal function. The left ventricle has no regional wall motion abnormalities. The left ventricular internal cavity size was normal in size. There is  moderate left ventricular hypertrophy. Left ventricular diastolic parameters are indeterminate. Right Ventricle: The right ventricular size is normal. No increase in right ventricular wall thickness. Right ventricular systolic function is normal. Tricuspid regurgitation signal is inadequate for assessing PA pressure. Left Atrium: Left atrial size was moderately dilated. Right Atrium: Right atrial size was normal in size. Pericardium: There is no evidence of pericardial effusion. Mitral Valve: The mitral valve is normal in structure. Trivial mitral valve regurgitation. No evidence of mitral valve stenosis. Tricuspid Valve: The tricuspid valve is normal in structure. Tricuspid valve regurgitation is trivial. No evidence of tricuspid stenosis. Aortic Valve: The aortic valve is normal in structure. Aortic valve regurgitation is not visualized. No aortic stenosis is present. Aortic valve mean gradient measures 5.0 mmHg. Aortic valve peak gradient measures 8.8 mmHg. Aortic valve area, by VTI measures 3.13 cm. Pulmonic Valve: The pulmonic valve was normal in structure. Pulmonic valve regurgitation is not visualized. No evidence of pulmonic stenosis. Aorta: The aortic root is normal in size and structure. Venous: The inferior vena cava was not well visualized. IAS/Shunts: No atrial level shunt detected by color flow Doppler. Additional Comments: A device lead is visualized.  LEFT VENTRICLE PLAX 2D LVIDd:         4.16 cm  Diastology LVIDs:         1.93 cm  LV e' medial:    4.68 cm/s LV PW:         1.42 cm  LV E/e' medial:  10.4 LV IVS:        1.22 cm  LV e' lateral:   6.31 cm/s LVOT diam:     2.20 cm  LV E/e' lateral: 7.7 LV SV:         74 LV SV Index:   39 LVOT  Area:     3.80 cm  RIGHT VENTRICLE RV Basal diam:  3.63 cm LEFT ATRIUM             Index  RIGHT ATRIUM           Index LA diam:        4.90 cm 2.56 cm/m  RA Area:     17.10 cm LA Vol (A2C):   65.6 ml 34.28 ml/m RA Volume:   47.40 ml  24.77 ml/m LA Vol (A4C):   95.6 ml 49.96 ml/m LA Biplane Vol: 87.1 ml 45.51 ml/m  AORTIC VALVE                    PULMONIC VALVE AV Area (Vmax):    2.88 cm     PV Vmax:        0.80 m/s AV Area (Vmean):   2.80 cm     PV Peak grad:   2.5 mmHg AV Area (VTI):     3.13 cm     RVOT Peak grad: 5 mmHg AV Vmax:           148.00 cm/s AV Vmean:          102.000 cm/s AV VTI:            0.237 m AV Peak Grad:      8.8 mmHg AV Mean Grad:      5.0 mmHg LVOT Vmax:         112.00 cm/s LVOT Vmean:        75.000 cm/s LVOT VTI:          0.195 m LVOT/AV VTI ratio: 0.82  AORTA Ao Root diam: 3.00 cm MITRAL VALVE               TRICUSPID VALVE MV Area (PHT): 4.68 cm    TR Peak grad:   16.8 mmHg MV Decel Time: 162 msec    TR Vmax:        205.00 cm/s MV E velocity: 48.80 cm/s MV A velocity: 48.40 cm/s  SHUNTS MV E/A ratio:  1.01        Systemic VTI:  0.20 m                            Systemic Diam: 2.20 cm Lorine Bears MD Electronically signed by Lorine Bears MD Signature Date/Time: 12/22/2020/12:19:20 PM    Final         Scheduled Meds: . apixaban  5 mg Oral BID  . ascorbic acid  1,000 mg Oral Daily  . colchicine  0.6 mg Oral BID  . metoprolol succinate  50 mg Oral Daily  . pantoprazole  80 mg Oral q AM  . potassium chloride  40 mEq Oral BID  . simvastatin  40 mg Oral QHS   Continuous Infusions:   LOS: 1 day    Time spent: 25 minutes    Tresa Moore, MD [facility] Pager [phone]  If 7PM-7AM, please contact night-coverage 12/22/2020, 1:39 PM

## 2020-12-22 NOTE — ED Notes (Signed)
Pt walked to toilet with steady gait. Pt back in bed.

## 2020-12-22 NOTE — ED Notes (Signed)
Called pharmacy re: apixaban dose. It is not verified yet, they said to keep heparin running until apixaban is verified and available.

## 2020-12-22 NOTE — Consult Note (Signed)
ANTICOAGULATION CONSULT NOTE   Pharmacy Consult for heparin Indication: chest pain/ACS  Allergies  Allergen Reactions  . Ciprofloxacin   . Codeine   . Tape     Pt gets like a burn from the adhesive  . Iodine Rash    Patient Measurements: Height: 5\' 5"  (165.1 cm) Weight: 83.9 kg (185 lb) IBW/kg (Calculated) : 57 Heparin Dosing Weight: 75  Vital Signs: BP: 144/93 (06/06 0930) Pulse Rate: 64 (06/06 0930)  Labs: Recent Labs    12/21/20 1125 12/21/20 1442 12/21/20 1623 12/22/20 0104 12/22/20 0402 12/22/20 0747 12/22/20 0950  HGB 12.9  --   --   --  11.6*  --   --   HCT 38.7  --   --   --  35.1*  --   --   PLT 197  --   --   --  170  --   --   APTT  --   --  22*  --  69*  --  60*  LABPROT  --   --  14.9  --  14.3  --   --   INR  --   --  1.2  --  1.1  --   --   HEPARINUNFRC  --   --   --  >1.10*  --   --  0.98*  CREATININE 1.55*  --   --   --   --   --  1.15*  TROPONINIHS 56* 764*  --   --   --  1,458*  --     Estimated Creatinine Clearance: 54.3 mL/min (A) (by C-G formula based on SCr of 1.15 mg/dL (H)).   Medical History: Past Medical History:  Diagnosis Date  . Atrial fibrillation (HCC)   . CKD (chronic kidney disease), stage III (HCC)   . Hypertension     Medications:  No anticoagulants prior to admission per chart review   Assessment: 60 yo F presenting with weakness and diaphoresis. Pt found to be in afib. Pt had ablation 2 weeks ago. PMH includes ICD and pacemaker placed in 2020, afib, and HTN. Pharmacy has been consulted to dose and monitor heparin for ACS/STEMI. Apixaban PTA. Troponin elevated to 1458. Anti-xa level falsely elevated. Will use aPTT to monitor heparin infusion.   6/05 0104 HL >1.10, supratherapeutic, but was falsely elevated.  6/06 0950 HL 0.98, aPTT 60   Goal of Therapy:  Heparin level 0.3-0.7 units/ml when aPTT and heparin level correlate.  APTT 66-102 seconds  Monitor platelets by anticoagulation protocol: Yes    Plan:  APTT  is subtherapeutic but on the lower limit of normal. Will increase heparin infusion to 900 units/hr as aPTT. Recheck aPTT in 6 hours. APTT was 69 at 0402 @6 /6 after heparin infusion was held for one hour. F/u with Anti-xa and CBC with AM labs. Switch to anti-xa monitoring once correlates with aPTT.   8/06, PharmD, BCPS 12/22/2020 10:38 AM

## 2020-12-22 NOTE — Consult Note (Addendum)
Cardiology Consultation:   Patient ID: Marie Nunez MRN: [id number]; DOB: May 05, 1961  Admit date: 12/21/2020 Date of Consult: 12/22/2020  PCP:  Aviva Kluver   [facility]  Cardiologist: [facility], Dr. Kirke Corin rounding Advanced Practice Provider:  No care team member to display Electrophysiologist:  None   :[id number]}  Patient Profile:   Marie Nunez is a 60 y.o. female with a hx of hypertrophic cardiomyopathy with 33% LGE burden s/p ICD implantation 06/2019, hyperlipidemia, GERD, highly symptomatic paroxysmal atrial fibrillation with significant left atrial enlargement and recent atrial fibrillation ablation / PVI with left femoral area swelling, CKD, HTN and who is being seen today for the evaluation of elevated troponin at the request of Dr. Georgeann Oppenheim.  History of Present Illness:   Ms. Babson is a 60 year old female with PMH as above and previously established with cardiology patient at [facility].  She reports that she is in town visiting her brother/family and was on her way back to [place] when she had the symptoms as outlined below.  No history of tobacco use.  No alcohol or illegal drug use.  She does have history of 01/10/2019 cardiac catheterization on review of EMR via Care Everywhere.  She was last seen by her primary cardiologist, Dr.Jayanthi Harlene Salts, at [facility] on 12/12/2020.  At that time, a left femoral triangle hematoma was noted and firm but nonfluctuant and not expanding.  No bruits in either femoral triangle noted.  Vitals stable.  She was maintaining NSR after her atrial fibrillation ablation / PVI.  She did report some left femoral edema.  On 12/21/2020, she was in her usual state of health when she went [place] go [place] herself, as she [place] not have [place] where she lives.  She reports that she was looking for her credit card when she felt sudden diaphoresis.  She states her brother had told her that if she ever felt a cold sweat such as  the when she was experiencing, she needs to call the emergency department.  She denies any chest pain at that time.  She felt a little woozy.  No nausea or emesis.  No shortness of breath.  No presyncope or syncope.  She reports that her symptoms lasted until she got to the emergency department and was started on IV Cardizem.  She is unable to estimate a timeline of how long her symptoms lasted.  She does report she is usually pretty symptomatic in atrial fibrillation, though she does not usually have diaphoresis.  Since that time, she denies any recurrent diaphoresis.  She reports a recent ablation procedure and wonders if this is contributing to her symptoms.  She reports she monitors her blood pressure at home, though at the time of consultation cannot recall previous pressures with estimated SBP 120s.  She reports medication compliance with this medication supplied by PCU.  In the emergency department, initial vitals significant for 103/49, ventricular rate 138, 100% on room air.  Today, 12/22/2020, cardiology is consulted for elevated troponin.  Labs showed potassium 3.1, creatinine 1.5, BUN 18, sodium 141, hemoglobin 12.9, hematocrit 38.7, initial high-sensitivity troponin 56 with most recent high-sensitivity peaked at 1458.0 and now down-trending, BNP 786.4, EKG at presentation showed atrial fibrillation with ventricular rate 150 bpm, significant baseline wander, LVH with repolarization abnormalities/IVCD, ST depression noted in multiple leads though difficult to assess due to baseline wander though appearing in V4 to V6, QTC 493. Subsequent EKG showed NSR, 60 bpm, ST depression noted in  the lateral, inferior, anterior leads (diffuse depression throughout all leads), LVH and repolarization abnormalities noted, QTC 458 ms.  EKG after that time showed 60 bpm, T wave inversion noted in I, II, III,, aVL, aVF, V2 to V6, ongoing depression noted throughout all leads, QTC 461, IVCD with QRS 103 ms/repolarization  abnormalities.   At the time of cardiology consultation, she denies any concerning symptoms of chest pain, shortness of breath, or recurrent diaphoresis.  No presyncope noted.  Past Medical History:  Diagnosis Date  . Atrial fibrillation (HCC)   . CKD (chronic kidney disease), stage III (HCC)   . Hypertension     Past Surgical History:  Procedure Laterality Date  . ABLATION    . ICD IMPLANT       Home Medications:  Prior to Admission medications   Medication Sig Start Date End Date Taking? Authorizing Provider  ascorbic acid (VITAMIN C) 1000 MG tablet Take 1 tablet by mouth daily.   Yes [provider]  Calcium Carbonate-Vitamin D3 600-400 MG-UNIT TABS Take 1 tablet by mouth daily.   Yes [provider]  colchicine 0.6 MG tablet Take 0.6 mg by mouth 2 (two) times daily. 12/03/20  Yes [provider]  cyanocobalamin 1000 MCG tablet Take 1 tablet by mouth daily.   Yes [provider]  ELIQUIS 5 MG TABS tablet Take 1 tablet by mouth 2 (two) times daily. 12/11/20  Yes [provider]  esomeprazole (NEXIUM) 40 MG capsule Take 1 capsule by mouth 2 (two) times daily. 11/26/20 12/03/21 Yes [provider]  Glucosamine-Chondroitin 500-400 MG CAPS Take 1 capsule by mouth daily. 05/22/19  Yes [provider]  losartan-hydrochlorothiazide (HYZAAR) 100-12.5 MG tablet Take 1 tablet by mouth daily. 12/16/20  Yes [provider]  metoprolol succinate (TOPROL-XL) 50 MG 24 hr tablet Take 50 mg by mouth daily. 10/16/20  Yes [provider]  simvastatin (ZOCOR) 40 MG tablet Take 40 mg by mouth at bedtime. 09/23/20  Yes [provider]  valACYclovir (VALTREX) 1000 MG tablet Take 1 tablet by mouth daily. 02/29/20  Yes [provider]  Vitamin D, Ergocalciferol, (DRISDOL) 1.25 MG (50000 UNIT) CAPS capsule Take 1 capsule by mouth once a week. Takes every thursday 11/26/20  Yes [provider]   amLODipine-atorvastatin (CADUET) 5-10 MG tablet Take 1 tablet by mouth daily. Patient not taking: Reported on 12/21/2020    [provider]  Calcium Carbonate-Vitamin D (CALCIUM-VITAMIN D) 600-125 MG-UNIT TABS Take 1 tablet by mouth daily. Patient not taking: Reported on 12/21/2020 05/22/19   [provider]  carvedilol (COREG) 12.5 MG tablet Take 1 tablet by mouth daily. Patient not taking: Reported on 12/21/2020    [provider]    Inpatient Medications: Scheduled Meds: . ascorbic acid  1,000 mg Oral Daily  . colchicine  0.6 mg Oral BID  . metoprolol succinate  50 mg Oral Daily  . pantoprazole  80 mg Oral q AM  . simvastatin  40 mg Oral QHS  . valACYclovir  1,000 mg Oral Daily   Continuous Infusions: . heparin 900 Units/hr (12/22/20 1049)   PRN Meds: acetaminophen, labetalol, ondansetron (ZOFRAN) IV  Allergies:    Allergies  Allergen Reactions  . Ciprofloxacin   . Codeine   . Tape     Pt gets like a burn from the adhesive  . Iodine Rash    Social History:   Social History   Socioeconomic History  . Marital status: Single    Spouse name:  Not on file  . Number of children: Not on file  . Years of education: Not on file  . Highest education level: Not on file  Occupational History  . Not on file  Tobacco Use  . Smoking status: Never Smoker  . Smokeless tobacco: Never Used  Vaping Use  . Vaping Use: Never used  Substance and Sexual Activity  . Alcohol use: Not Currently  . Drug use: Never  . Sexual activity: Not Currently  Other Topics Concern  . Not on file  Social History Narrative  . Not on file   Social Determinants of Health   Financial Resource Strain: Not on file  Food Insecurity: Not on file  Transportation Needs: Not on file  Physical Activity: Not on file  Stress: Not on file  Social Connections: Not on file  Intimate Partner Violence: Not on file    Family History:    Family History  Problem Relation Age of Onset   . Heart disease Father   . Heart attack Brother      ROS:  Please see the history of present illness.  Review of Systems  Constitutional: Positive for diaphoresis and malaise/fatigue.  Respiratory: Negative for cough, hemoptysis, shortness of breath and wheezing.   Cardiovascular: Negative for chest pain, orthopnea and leg swelling.  Gastrointestinal: Negative for blood in stool and melena.  Neurological: Positive for dizziness. Negative for focal weakness and loss of consciousness.  All other systems reviewed and are negative.   All other ROS reviewed and negative.     Physical Exam/Data:   Vitals:   12/22/20 1000 12/22/20 1030 12/22/20 1100 12/22/20 1130  BP: (!) 138/97 (!) 139/100  133/71  Pulse: 68 80 62 65  Resp: (!) 21 16 13 17   Temp:      TempSrc:      SpO2: 100% 99% 99% 100%  Weight:      Height:       No intake or output data in the 24 hours ending 12/22/20 1203 Last 3 Weights 12/21/2020  Weight (lbs) 185 lb  Weight (kg) 83.915 kg     Body mass index is 30.79 kg/m.  General:  Well nourished, well developed, in no acute distress HEENT: normal Lymph: no adenopathy Neck: no JVD, JVP ~8-9 Endocrine:  No thryomegaly Vascular: No carotid bruits; FA pulses 2+ bilaterally without bruits  Cardiac:  normal S1, S2; distant or muffled heart sounds, 1/6 holosystolic murmur, RRR with extrasystole Lungs: bibasilar crackles, no wheezing, rhonchi  Abd: slightly firm, slightly distended, no hepatomegaly  Ext: no pitting edema Musculoskeletal:  No deformities, BUE and BLE strength normal and equal Skin: warm and dry  Neuro:  CNs 2-12 intact, no focal abnormalities noted Psych:  Normal affect   EKG:  The EKG was personally reviewed and demonstrates:  EKG at presentation showed atrial fibrillation with ventricular rate 150 bpm, significant baseline wander, LVH with repolarization abnormalities/IVCD, ST depression noted in multiple leads though difficult to assess due to  baseline wander though appearing in V4 to V6, QTC 493. Subsequent EKG showed NSR, 60 bpm, ST depression noted in the lateral, inferior, anterior leads (diffuse depression throughout all leads), LVH and repolarization abnormalities noted, QTC 458 ms.  EKG after that time showed 60 bpm, T wave inversion noted in I, II, III,, aVL, aVF, V2 to V6, ongoing depression noted throughout all leads, QTC 461, IVCD with QRS 103 ms/repolarization abnormalities.  Telemetry:  Telemetry was personally reviewed and demonstrates: NSR, NSVT, longest run of  NSVT 7 beats, SVT/AT/atrial fibrillation, ventricular rates 60s to 150s  Relevant CV Studies: Echo ([facility]where) 09/08/20 CONCLUSIONS  * Left ventricular systolic function is normal with an ejection fraction of  55 % by Simpson's biplane.  * Left ventricular chamber size is normal.  * There is concentric left ventricular hypertrophy with a severely thickened  septal wall and mildly thickened posterior wall. Severe hypertrophy mid to  apex.  * Left ventricular diastolic function: Grade I diastolic dysfunction.  * Right ventricular systolic function is normal with TAPSE measuring 1.70 cm  and TAPSV of 12 cm/s.  *Right ventricular chamber dimension is normal.  * ICD lead visualized in right heart.  * Left atrial chamber is moderately enlarged with a left atrial volume index  of 41.86 ml/m^2 by BP MOD.  * There is mild to moderate mitral valve regurgitation.  * No pulmonary hypertension, estimated pulmonary arterial systolic pressure  is 24 mmHg.    LHC 09/15/2018 CORONARY ANGIOGRAPHY:  1) Left main coronary artery: The left main coronary artery is  normal.  2) Left anterior descending coronary artery: The left anterior  descending coronary artery and its branches are normal.  3) Left circumflex coronary artery: The left circumflex coronary  artery is nondominant. The left circumflex coronary artery and  its branches are normal.   4) Right coronary artery: The right coronary artery is dominant.   The right coronary artery and its branches are normal.  LEFT VENTRICLE:  LVEDP: 32 mmHg  Ejection fraction: 60%  Regional wall motion: Normal  Mitral regurgitation: Moderate  Aortic stenosis: absent  HEMODYNAMICS:  Heart rate: 63 bpm.   Left ventricular pressure: 158/20 mmHg.  Left ventricular end-diastolic pressure: 32 mmHg.  Ascending aortic pressure: 145/94 mmHg.  Mean ascending aortic pressure: 116 mmHg.  IMPRESSION:  1. Coronaries: Normal coronary arteries.  2. Left ventricle: Normal left ventricular systolic function.  3. Moderate mitral regurgitation.  RECOMMENDATIONS:  Continue with medical management for paroxysmal atrial  fibrillation. Medical management should include the beta-blocker  metoprolol and a direct oral anticoagulant such as Eliquis or  Xarelto.   12/2018  1. The left ventricle is significantly hypertrophied, most notably in the mid cavity, apical segments and  septum. Thereis mid cavitary and apical obliteration with systole. No evidence of systolic anterior  motion. 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 Significant LV myocardial fibrosis is noted.   2. The right ventricle is normal in cavity size and systolic function. The right ventricle appears  thickened, with mild trabeculations.   3. Left atrium is moderately enlarged.   There are single superior and inferior pulmonary veins bilaterally. Detailed measurements as follows (in  the vertical and transverse dimensions):   Right Superior: 20 x 15 mm  Right Inferior: 17 x 17 mm   Left Superior: 13 x 7 mm  Left inferior: 22 x 10 mm   No evidence of thrombus within the left atrial appendage. The esophagus abuts the posterior wall of the  left atrium along the origin of the left pulmonary veins.   4. The aortic valve is trileaflet in  morphology. There is mild mitral wall thickening. There is dephasing  jet of mitral and tricuspid regurgitation.   5. There is significant heterogeneous delayed myocardial enhancement at the left ventricular mid and apical  segments.   Laboratory Data:  High Sensitivity Troponin:   Recent Labs  Lab 12/21/20 1125 12/21/20 1442 12/22/20 0747 12/22/20 0950  TROPONINIHS 56* 764* 1,458* 1,288*     Chemistry Recent Labs  Lab 12/21/20 1125 12/22/20 0950  NA 141 140  K 3.1* 3.3*  CL 108 108  CO2 23 25  GLUCOSE 156* 92  BUN 18 14  CREATININE 1.55* 1.15*  CALCIUM 10.6* 10.6*  GFRNONAA 38* 54*  ANIONGAP 10 7    No results for input(s): PROT, ALBUMIN, AST, ALT, ALKPHOS, BILITOT in the last 168 hours. Hematology Recent Labs  Lab 12/21/20 1125 12/22/20 0402  WBC 3.7* 4.7  RBC 4.57 4.11  HGB 12.9 11.6*  HCT 38.7 35.1*  MCV 84.7 85.4  MCH 28.2 28.2  MCHC 33.3 33.0  RDW 14.7 14.7  PLT 197 170   BNP Recent Labs  Lab 12/21/20 1125  BNP 786.4*    DDimer No results for input(s): DDIMER in the last 168 hours.   Radiology/Studies:  DG Chest Portable 1 View  Result Date: 12/21/2020 CLINICAL DATA:  Atrial fibrillation EXAM: PORTABLE CHEST 1 VIEW COMPARISON:  None. FINDINGS: Lungs are clear. Heart is enlarged with pulmonary vascularity normal. Pacemaker leads are attached to the right atrium and right ventricle. No adenopathy. No pneumothorax. No bone lesions. IMPRESSION: Cardiomegaly with pacemaker leads attached to right atrium and right ventricle. Lungs clear. Electronically Signed   By: Bretta BangWilliam  Woodruff III M.D.   On: 12/21/2020 11:35     Assessment and Plan:   NSTEMI -- No chest pain now or at the time of her event, during which time she notes diaphoresis and feeling woozy.  She reports she usually feels her heart race when in atrial fibrillation, though during this instance of atrial fibrillation, she did not feel any racing heart rate, which was unusual for her.   Symptoms lasted until receipt of IV Cardizem.  No recurrence since that time.  Previous 2020 cath as above without significant CAD.  She recently underwent ablation for atrial fibrillation with subsequent left femoral tenderness/hematoma.  Given her recent ablation procedure, reasonable to rule out any complications from this procedure.  Obtain echo to assess if any pericardial effusion or pericarditis.  She has already been on colchicine 0.6mg  BID with first dose 12/21/20. Continue. Echo will also allow us to assess EF and wall motion, as well as valvular function.  As above, she does have a history of hypertrophic cardiomyopathy, which could also be contributing to her symptoms.  Further recommendations as indicated pending echo.  At this time, we will start a heart healthy diet.  Continue IV heparin. Continue BB and simvastatin 40mg  qd. Tentative plan for n.p.o. after midnight with catheterization 12/23/2020 pending echo.   Hypertrophic cardiomyopathy -- Previous echo as above.  Consider is contributing to her symptoms.  Repeat echo ordered and pending.  S/p ICD as above.  Continue to monitor telemetry for any arrhythmia.  At this time, no significant arrhythmia on telemetry.  Atrial fibrillation s/p ablation PVI --Most recent telemetry with NSR. Earlier Afib with RVR. Given her recent ablation, consider some element of troponin elevation due to ablation procedure.  We will obtain echo to rule out cardiac effusion.  Also considered is troponin elevation due to ventricular rate, given her recurrent atrial fibrillation.  We discussed that this sometimes is an occurrence immediately following ablation.  Continue IV Cardizem for ventricular rate control with goal rates below 110 at rest. [facility]   Chronic HFpEF -- Denies any significant shortness of breath.  Previous CE echo as above. Pending updated echo. Given HCM, will  recommend ensure against dehydration but caution against  aggressive IVF as want to avoid volume overload. Rule out pericardial effusion a as above and reassess EF, structure. Replete electrolytes. Of note, home medications include losartan - HCTZ 100-12.5mg  qd and Toprol  qd.   Hypokalemia -- Current potassium 3.3 with goal 4.0.  Replete.  Check magnesium with goal 2.0.  HTN -- Recent BP improving. Continue to monitor.       TIMI Risk Score for Unstable Angina or Non-ST Elevation MI:   The patient's TIMI risk score is 3, which indicates a 13% risk of all cause mortality, new or recurrent myocardial infarction or need for urgent revascularization in the next 14 days.   New York Heart Association (NYHA) Functional Class NYHA Class I-II  CHA2DS2-VASc Score = 3  This indicates a 3.2% annual risk of stroke. The patient's score is based upon: CHF History: Yes HTN History: Yes Diabetes History: No Stroke History: No Vascular Disease History: No Age Score: 0 Gender Score: 1         For questions or updates, please contact [facility] Please consult [email or web address] for contact info under    Signed, Lennon Alstrom, PA-C  12/22/2020 12:03 PM

## 2020-12-22 NOTE — ED Notes (Signed)
Dr Georgeann Oppenheim at bedside discussing plan of care with pt.  Pt denies chest pain and SOB at this time. Troponin drawn per order.

## 2020-12-22 NOTE — ED Notes (Signed)
Pt denies any complaints or needs at this time, informed pt we are waiting on transport for pt to be moved to room on floor.

## 2020-12-22 NOTE — ED Notes (Signed)
Informed CN that bed assigned since 30 min but no RN assigned to pt.

## 2020-12-23 DIAGNOSIS — I4891 Unspecified atrial fibrillation: Secondary | ICD-10-CM

## 2020-12-23 LAB — CBC
HCT: 40.1 % (ref 36.0–46.0)
Hemoglobin: 13.3 g/dL (ref 12.0–15.0)
MCH: 28.3 pg (ref 26.0–34.0)
MCHC: 33.2 g/dL (ref 30.0–36.0)
MCV: 85.3 fL (ref 80.0–100.0)
Platelets: 179 10*3/uL (ref 150–400)
RBC: 4.7 MIL/uL (ref 3.87–5.11)
RDW: 14.6 % (ref 11.5–15.5)
WBC: 3.9 10*3/uL — ABNORMAL LOW (ref 4.0–10.5)
nRBC: 0 % (ref 0.0–0.2)

## 2020-12-23 LAB — BASIC METABOLIC PANEL
Anion gap: 11 (ref 5–15)
BUN: 16 mg/dL (ref 8–23)
CO2: 21 mmol/L — ABNORMAL LOW (ref 22–32)
Calcium: 11 mg/dL — ABNORMAL HIGH (ref 8.9–10.3)
Chloride: 108 mmol/L (ref 98–111)
Creatinine, Ser: 1.32 mg/dL — ABNORMAL HIGH (ref 0.44–1.00)
GFR, Estimated: 46 mL/min — ABNORMAL LOW (ref >60)
Glucose, Bld: 104 mg/dL — ABNORMAL HIGH (ref 70–99)
Potassium: 4 mmol/L (ref 3.5–5.1)
Sodium: 140 mmol/L (ref 135–145)

## 2020-12-23 LAB — HIV ANTIBODY (ROUTINE TESTING W REFLEX): HIV Screen 4th Generation wRfx: NONREACTIVE

## 2020-12-23 MED ORDER — POTASSIUM CHLORIDE CRYS ER 20 MEQ PO TBCR
40.0000 meq | EXTENDED_RELEASE_TABLET | Freq: Once | ORAL | Status: AC
  Administered 2020-12-23: 40 meq via ORAL
  Filled 2020-12-23: qty 2

## 2020-12-23 NOTE — Progress Notes (Addendum)
Progress Note  Patient Name: Marie Nunez Date of Encounter: 12/23/2020  Primary Cardiologist: [facility], Dr. Kirke Corin rounding  Subjective   No chest pain or tightness.  No further diaphoresis. No racing HR or palpitations. No dizziness. No SOB. Reviewed echo results.  Per pt request, examined her bilateral femoral sites. L femoral site with very small hematoma. Discussed warm compress recommendation. No signs of infection.  Will check a BMET as below  Inpatient Medications    Scheduled Meds: . apixaban  5 mg Oral BID  . ascorbic acid  1,000 mg Oral Daily  . colchicine  0.6 mg Oral BID  . losartan  100 mg Oral Daily   And  . hydrochlorothiazide  12.5 mg Oral Daily  . metoprolol succinate  50 mg Oral Daily  . pantoprazole  80 mg Oral q AM  . simvastatin  40 mg Oral QHS   Continuous Infusions:  PRN Meds: acetaminophen, hydrALAZINE, ondansetron (ZOFRAN) IV, valACYclovir   Vital Signs    Vitals:   12/22/20 1930 12/22/20 2011 12/23/20 0415 12/23/20 0831  BP: (!) 132/91 (!) 137/98 (!) 151/99 (!) 152/96  Pulse: (!) 59 60 60 61  Resp: 15 18 18 16   Temp:  98.2 F (36.8 C) 98.1 F (36.7 C) 98.9 F (37.2 C)  TempSrc:  Oral Oral   SpO2: 98% 100% 100% 98%  Weight:      Height:        Intake/Output Summary (Last 24 hours) at 12/23/2020 0841 Last data filed at 12/23/2020 0415 Gross per 24 hour  Intake 93.52 ml  Output 800 ml  Net -706.48 ml   Last 3 Weights 12/21/2020  Weight (lbs) 185 lb  Weight (kg) 83.915 kg      Telemetry    NSR, 60s-70s - Personally Reviewed  ECG    No new tracings - Personally Reviewed  Physical Exam   GEN: No acute distress.   Neck: No JVD Cardiac: Distant heart sounds. RRR, no murmurs, rubs, or gallops.  Respiratory: Clear to auscultation bilaterally. GI: Soft, nontender, non-distended  MS: No edema; No deformity. Neuro:  Nonfocal  Psych: Normal affect   Labs    High Sensitivity Troponin:   Recent Labs  Lab 12/21/20 1125  12/21/20 1442 12/22/20 0747 12/22/20 0950  TROPONINIHS 56* 764* 1,458* 1,288*      Chemistry Recent Labs  Lab 12/21/20 1125 12/22/20 0950  NA 141 140  K 3.1* 3.3*  CL 108 108  CO2 23 25  GLUCOSE 156* 92  BUN 18 14  CREATININE 1.55* 1.15*  CALCIUM 10.6* 10.6*  GFRNONAA 38* 54*  ANIONGAP 10 7     Hematology Recent Labs  Lab 12/21/20 1125 12/22/20 0402 12/23/20 0508  WBC 3.7* 4.7 3.9*  RBC 4.57 4.11 4.70  HGB 12.9 11.6* 13.3  HCT 38.7 35.1* 40.1  MCV 84.7 85.4 85.3  MCH 28.2 28.2 28.3  MCHC 33.3 33.0 33.2  RDW 14.7 14.7 14.6  PLT 197 170 179    BNP Recent Labs  Lab 12/21/20 1125  BNP 786.4*     DDimer No results for input(s): DDIMER in the last 168 hours.   Radiology    DG Chest Portable 1 View  Result Date: 12/21/2020 CLINICAL DATA:  Atrial fibrillation EXAM: PORTABLE CHEST 1 VIEW COMPARISON:  None. FINDINGS: Lungs are clear. Heart is enlarged with pulmonary vascularity normal. Pacemaker leads are attached to the right atrium and right ventricle. No adenopathy. No pneumothorax. No bone lesions. IMPRESSION: Cardiomegaly with pacemaker leads  attached to right atrium and right ventricle. Lungs clear. Electronically Signed   By: Bretta BangWilliam  Woodruff III M.D.   On: 12/21/2020 11:35   ECHOCARDIOGRAM COMPLETE  Result Date: 12/22/2020    ECHOCARDIOGRAM REPORT   Patient Name:   Marie Nunez Date of Exam: 12/22/2020 Medical Rec #:  [id number]     Height:       65.0 in Accession #:    [id number]    Weight:       185.0 lb Date of Birth:  05/06/1961      BSA:          1.914 m Patient Age:    60 years      BP:           144/93 mmHg Patient Gender: F             HR:           645 bpm. Exam Location:  [facility] Procedure: 2D Echo, Cardiac Doppler and Color Doppler Indications:     Elevated Troponin  History:         Patient has no prior history of Echocardiogram examinations.                  Defibrillator, Arrythmias:Atrial Fibrillation; Risk                  Factors:Hypertension.   Sonographer:     Cristela BlueJerry Hege RDCS (AE) Referring Phys:  [id number] Lennon AlstromJACQUELYN D Maija Biggers Diagnosing Phys: Lorine BearsMuhammad Arida MD  Sonographer Comments: Suboptimal apical window. IMPRESSIONS  1. Left ventricular ejection fraction, by estimation, is 65 to 70%. The left ventricle has normal function. The left ventricle has no regional wall motion abnormalities. There is moderate left ventricular hypertrophy. Left ventricular diastolic parameters are indeterminate.  2. Right ventricular systolic function is normal. The right ventricular size is normal. Tricuspid regurgitation signal is inadequate for assessing PA pressure.  3. Left atrial size was moderately dilated.  4. The mitral valve is normal in structure. Trivial mitral valve regurgitation. No evidence of mitral stenosis.  5. The aortic valve is normal in structure. Aortic valve regurgitation is not visualized. No aortic stenosis is present.  6. Prominent apical hypertrophy. FINDINGS  Left Ventricle: Left ventricular ejection fraction, by estimation, is 65 to 70%. The left ventricle has normal function. The left ventricle has no regional wall motion abnormalities. The left ventricular internal cavity size was normal in size. There is  moderate left ventricular hypertrophy. Left ventricular diastolic parameters are indeterminate. Right Ventricle: The right ventricular size is normal. No increase in right ventricular wall thickness. Right ventricular systolic function is normal. Tricuspid regurgitation signal is inadequate for assessing PA pressure. Left Atrium: Left atrial size was moderately dilated. Right Atrium: Right atrial size was normal in size. Pericardium: There is no evidence of pericardial effusion. Mitral Valve: The mitral valve is normal in structure. Trivial mitral valve regurgitation. No evidence of mitral valve stenosis. Tricuspid Valve: The tricuspid valve is normal in structure. Tricuspid valve regurgitation is trivial. No evidence of tricuspid stenosis.  Aortic Valve: The aortic valve is normal in structure. Aortic valve regurgitation is not visualized. No aortic stenosis is present. Aortic valve mean gradient measures 5.0 mmHg. Aortic valve peak gradient measures 8.8 mmHg. Aortic valve area, by VTI measures 3.13 cm. Pulmonic Valve: The pulmonic valve was normal in structure. Pulmonic valve regurgitation is not visualized. No evidence of pulmonic stenosis. Aorta: The aortic root is normal in size and structure. Venous: The inferior  vena cava was not well visualized. IAS/Shunts: No atrial level shunt detected by color flow Doppler. Additional Comments: A device lead is visualized.  LEFT VENTRICLE PLAX 2D LVIDd:         4.16 cm  Diastology LVIDs:         1.93 cm  LV e' medial:    4.68 cm/s LV PW:         1.42 cm  LV E/e' medial:  10.4 LV IVS:        1.22 cm  LV e' lateral:   6.31 cm/s LVOT diam:     2.20 cm  LV E/e' lateral: 7.7 LV SV:         74 LV SV Index:   39 LVOT Area:     3.80 cm  RIGHT VENTRICLE RV Basal diam:  3.63 cm LEFT ATRIUM             Index       RIGHT ATRIUM           Index LA diam:        4.90 cm 2.56 cm/m  RA Area:     17.10 cm LA Vol (A2C):   65.6 ml 34.28 ml/m RA Volume:   47.40 ml  24.77 ml/m LA Vol (A4C):   95.6 ml 49.96 ml/m LA Biplane Vol: 87.1 ml 45.51 ml/m  AORTIC VALVE                    PULMONIC VALVE AV Area (Vmax):    2.88 cm     PV Vmax:        0.80 m/s AV Area (Vmean):   2.80 cm     PV Peak grad:   2.5 mmHg AV Area (VTI):     3.13 cm     RVOT Peak grad: 5 mmHg AV Vmax:           148.00 cm/s AV Vmean:          102.000 cm/s AV VTI:            0.237 m AV Peak Grad:      8.8 mmHg AV Mean Grad:      5.0 mmHg LVOT Vmax:         112.00 cm/s LVOT Vmean:        75.000 cm/s LVOT VTI:          0.195 m LVOT/AV VTI ratio: 0.82  AORTA Ao Root diam: 3.00 cm MITRAL VALVE               TRICUSPID VALVE MV Area (PHT): 4.68 cm    TR Peak grad:   16.8 mmHg MV Decel Time: 162 msec    TR Vmax:        205.00 cm/s MV E velocity: 48.80 cm/s MV A  velocity: 48.40 cm/s  SHUNTS MV E/A ratio:  1.01        Systemic VTI:  0.20 m                            Systemic Diam: 2.20 cm Lorine Bears MD Electronically signed by Lorine Bears MD Signature Date/Time: 12/22/2020/12:19:20 PM    Final     Cardiac Studies  Echo 11/21/20 1. Left ventricular ejection fraction, by estimation, is 65 to 70%. The  left ventricle has normal function. The left ventricle has no regional  wall motion abnormalities. There is moderate left ventricular hypertrophy.  Left ventricular diastolic  parameters are indeterminate.  2. Right ventricular systolic function is normal. The right ventricular  size is normal. Tricuspid regurgitation signal is inadequate for assessing  PA pressure.  3. Left atrial size was moderately dilated.  4. The mitral valve is normal in structure. Trivial mitral valve  regurgitation. No evidence of mitral stenosis.  5. The aortic valve is normal in structure. Aortic valve regurgitation is  not visualized. No aortic stenosis is present.  6. Prominent apical hypertrophy.   Echo (CareEverywhere) 09/08/20 CONCLUSIONS  * Left ventricular systolic function is normal with an ejection fraction of  55 % by Simpson's biplane.  * Left ventricular chamber size is normal.  * There is concentric left ventricular hypertrophy with a severely thickened  septal wall and mildly thickened posterior wall. Severe hypertrophy mid to  apex.  * Left ventricular diastolic function: Grade I diastolic dysfunction.  * Right ventricular systolic function is normal with TAPSE measuring 1.70 cm  and TAPSV of 12 cm/s.  *Right ventricular chamber dimension is normal.  * ICD lead visualized in right heart.  * Left atrial chamber is moderately enlarged with a left atrial volume index  of 41.86 ml/m^2 by BP MOD.  * There is mild to moderate mitral valve regurgitation.  * No pulmonary hypertension, estimated pulmonary arterial systolic pressure   is 24 mmHg.   [facility] 09/15/2018 CORONARY ANGIOGRAPHY:  1) Left main coronary artery: The left main coronary artery is  normal.  2) Left anterior descending coronary artery: The left anterior  descending coronary artery and its branches are normal.  3) Left circumflex coronary artery: The left circumflex coronary  artery is nondominant. The left circumflex coronary artery and  its branches are normal.  4) Right coronary artery: The right coronary artery is dominant.   The right coronary artery and its branches are normal.  LEFT VENTRICLE:  LVEDP: 32 mmHg  Ejection fraction: 60%  Regional wall motion: Normal  Mitral regurgitation: Moderate  Aortic stenosis: absent  HEMODYNAMICS:  Heart rate: 63 bpm.   Left ventricular pressure: 158/20 mmHg.  Left ventricular end-diastolic pressure: 32 mmHg.  Ascending aortic pressure: 145/94 mmHg.  Mean ascending aortic pressure: 116 mmHg.  IMPRESSION:  1. Coronaries: Normal coronary arteries.  2. Left ventricle: Normal left ventricular systolic function.  3. Moderate mitral regurgitation.  RECOMMENDATIONS:  Continue with medical management for paroxysmal atrial  fibrillation. Medical management should include the beta-blocker  metoprolol and a direct oral anticoagulant such as Eliquis or  Xarelto.   12/2018  1. The left ventricle is significantly hypertrophied, most notably in the mid cavity, apical segments and  septum. Thereis mid cavitary and apical obliteration with systole. No evidence of systolic anterior  motion. 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 Significant LV myocardial fibrosis is noted.   2. The right ventricle is normal in cavity size and systolic function. The right ventricle appears  thickened, with mild trabeculations.   3. Left atrium is moderately enlarged.   There are single superior and inferior pulmonary veins  bilaterally. Detailed measurements as follows (in  the vertical and transverse dimensions):   Right Superior: 20 x 15 mm  Right Inferior: 17 x 17 mm   Left Superior: 13 x 7 mm  Left inferior: 22 x 10 mm   No evidence of thrombus within the left atrial appendage. The esophagus abuts the posterior wall of the  left atrium along the origin of  the left pulmonary veins.   4. The aortic valve is trileaflet in morphology. There is mild mitral wall thickening. There is dephasing  jet of mitral and tricuspid regurgitation.   5. There is significant heterogeneous delayed myocardial enhancement at the left ventricular mid and apical  segments.   Patient Profile     60 y.o. 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  Assessment & Plan    NSTEMI -- Never with any CP. No current CP. No further diaphoresis since admission.  Previous 2020 cath as above without significant CAD / nl cors Presented to [facility] on her way back to [place] in Afib with RVR and subsequent elevated HS Tn, peaking at 1,458.  Echo obtained as above with hyperdynamic LVSF and apical hypertrophy but normal wall motion and without any significant pericardial effusion or valvular abnormalities. Suspect elevated HS Tn 2/2 RVR and supply demand ischemia. No arrhythmia on overnight monitoring. Pending MD to see pt, she can be discharged from a cardiac standpoint and plan for [facility]. Continue Eliquis 5mg  BID at discharge with IV heparin discontinued.   Apical Hypertrophic cardiomyopathy Chronic HFpEF -- No SOB. Euvolemic on exam. Repeat echo as above with hyperdynamic EF and apical hypertrophy. S/p  ICD as above.  No arrhythmia overnight. Continue current medications. Follow-up as recommended per primary cardiologist in .  Atrial fibrillation s/p ablation PVI --Maintaining NSR overnight. As discussed with pt, recurrent Afib is often seen in the early period after an ablation due to increased inflammation. Discussed purchase of smartwatch to monitor her rates. Continue colchicine. Continue BB. IV heparin was switched to home Eliquis 5mg  BID today. H&H stable.  HTN -- BP elevated and pending AM medications. Ensure BP well controlled before discharge with goal BP 130/80 or lower. Continue current PTA medications. If BP remains elevated, could consider transition from metoprolol to diltiazem in the future; however, will defer to [facility] / primary cardiologist.   HLD --Continue statin.  Hypokalemia --No BMET yet today. Will order to ensure potassium repleted and at goal. She may need a K supplementation with her HCTZ to be determined pending today's BMET. Recommend recheck with home cardiologist to ensure stable at that time.   For questions or updates, please contact [facility] Please consult [email or web address] for contact info under        Signed, , PA-C  12/23/2020, 8:41 AM

## 2020-12-23 NOTE — Discharge Summary (Signed)
Physician Discharge Summary  Marie Nunez ZOX:[id number] DOB: 28-Jul-1960 DOA: 12/21/2020  PCP: Pcp, No  Admit date: 12/21/2020 Discharge date: 12/23/2020  Admitted From: Home Disposition: Home  Recommendations for Outpatient Follow-up:  1. Follow up with PCP in 1-2 weeks 2. Follow-up with cardiologist as directed  Home Health: No Equipment/Devices: None Discharge Condition: Stable CODE STATUS: Full Diet recommendation: Heart Healthy Brief/Interim Summary: 60 y.o.femalewith medical history significant foratrial fibrillation s/p atrial ablation on 12/03/20, GERD,hypertension, hyperlipidemia, hypertrophic cardiomyopathy, stage IIIa CKD, presents to the emergency department for chief concerns of diaphoresis.  She reports that she experienced diaphoresis and nausea that started approximately 10:02 AM while outside the parking lot of [employer]. She hasn't eatenanythingyet. She has not had food today. She has had two sips of water only.  Shereports that shefelt this way in January 2022, when she was shopping.She went home and then [facility] and was told to go to the ED, where she was monitored in the ED for several hours and sent home.She states that she was in the ED for approximately 5 hours.She also reports that the ED provider discussed with her cardiologist at that time and recommended that she be discharged home.  She states that during both times, she denies arm pain, jaw discomfort, neck discomfort, abnormal taste in her mouth.   She endorsed initial left finger tingling and then right sided tinglingtoday while she was in the ED.  Troponins were elevated to 700 and subsequently 1400.  Cardiology was consulted after initiation of heparin GTT for presumed NSTEMI.  Case discussed with cardiology.  EKG and echocardiogram reviewed.  Initially was in atrial fibrillation however rate controlled after IV Cardizem push.  Elevated troponin likely some demand  ischemia.  No plans for ischemic evaluation during this admission.  Discharge Diagnoses:  Principal Problem:   NSTEMI (non-ST elevated myocardial infarction) (HCC) Active Problems:   Essential hypertension   Hyperlipidemia, mixed   CKD (chronic kidney disease) stage 3, GFR 30-59 ml/min (HCC)   AKI (acute kidney injury) (HCC)   Atrial fibrillation with rapid ventricular response (HCC)  Atrial fibrillation right ventricular response Elevated troponin Hypertrophic cardiomyopathy  presented with palpitations and diaphoresis Converted to sinus rhythm after IV push of Cardizem Elevated troponin secondary to supply demand ischemia, not representative of NSTEMI or ACS Echocardiogram reassuring No ischemic evaluation Remained stable on telemetry Discharge home with outpatient follow-up with a cardiologist in [place]  Discharge Instructions  Discharge Instructions    Diet - low sodium heart healthy   Complete by: As directed    Increase activity slowly   Complete by: As directed      Allergies as of 12/23/2020      Reactions   Ciprofloxacin    Codeine    Tape    Pt gets like a burn from the adhesive   Iodine Rash      Medication List    STOP taking these medications   amLODipine-atorvastatin 5-10 MG tablet Commonly known as: CADUET   Calcium-Vitamin D 600-125 MG-UNIT Tabs   carvedilol 12.5 MG tablet Commonly known as: COREG     TAKE these medications   ascorbic acid 1000 MG tablet Commonly known as: VITAMIN C Take 1 tablet by mouth daily.   Calcium Carbonate-Vitamin D3 600-400 MG-UNIT Tabs Take 1 tablet by mouth daily.   colchicine 0.6 MG tablet Take 0.6 mg by mouth 2 (two) times daily.   cyanocobalamin 1000 MCG tablet Take 1 tablet by mouth daily.   Eliquis 5 MG Tabs  tablet Generic drug: apixaban Take 1 tablet by mouth 2 (two) times daily.   esomeprazole 40 MG capsule Commonly known as: NEXIUM Take 1 capsule by mouth 2 (two) times daily.    Glucosamine-Chondroitin 500-400 MG Caps Take 1 capsule by mouth daily.   losartan-hydrochlorothiazide 100-12.5 MG tablet Commonly known as: HYZAAR Take 1 tablet by mouth daily.   metoprolol succinate 50 MG 24 hr tablet Commonly known as: TOPROL-XL Take 50 mg by mouth daily.   simvastatin 40 MG tablet Commonly known as: ZOCOR Take 40 mg by mouth at bedtime.   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   Vitamin D (Ergocalciferol) 1.25 MG (50000 UNIT) Caps capsule Commonly known as: DRISDOL Take 1 capsule by mouth once a week. Takes every thursday       Allergies  Allergen Reactions  . Ciprofloxacin   . Codeine   . Tape     Pt gets like a burn from the adhesive  . Iodine Rash    Consultations:  Cardiology   Procedures/Studies: DG Chest Portable 1 View  Result Date: 12/21/2020 CLINICAL DATA:  Atrial fibrillation EXAM: PORTABLE CHEST 1 VIEW COMPARISON:  None. FINDINGS: Lungs are clear. Heart is enlarged with pulmonary vascularity normal. Pacemaker leads are attached to the right atrium and right ventricle. No adenopathy. No pneumothorax. No bone lesions. IMPRESSION: Cardiomegaly with pacemaker leads attached to right atrium and right ventricle. Lungs clear. Electronically Signed   By: Bretta BangWilliam  Woodruff III M.D.   On: 12/21/2020 11:35   ECHOCARDIOGRAM COMPLETE  Result Date: 12/22/2020    ECHOCARDIOGRAM REPORT   Patient Name:   Marie Nunez Date of Exam: 12/22/2020 Medical Rec #:  [id number]     Height:       65.0 in Accession #:    [id number]    Weight:       185.0 lb Date of Birth:  06/07/1961      BSA:          1.914 m Patient Age:    60 years      BP:           144/93 mmHg Patient Gender: F             HR:           645 bpm. Exam Location:  [facility] Procedure: 2D Echo, Cardiac Doppler and Color Doppler Indications:     Elevated Troponin  History:         Patient has no prior history of Echocardiogram examinations.                   Defibrillator, Arrythmias:Atrial Fibrillation; Risk                  Factors:Hypertension.  Sonographer:     Cristela BlueJerry Hege RDCS (AE) Referring Phys:  [id number] Lennon AlstromJACQUELYN D VISSER Diagnosing Phys: Lorine BearsMuhammad Arida MD  Sonographer Comments: Suboptimal apical window. IMPRESSIONS  1. Left ventricular ejection fraction, by estimation, is 65 to 70%. The left ventricle has normal function. The left ventricle has no regional wall motion abnormalities. There is moderate left ventricular hypertrophy. Left ventricular diastolic parameters are indeterminate.  2. Right ventricular systolic function is normal. The right ventricular size is normal. Tricuspid regurgitation signal is inadequate for assessing PA pressure.  3. Left atrial size was moderately dilated.  4. The mitral valve is normal in structure. Trivial mitral valve regurgitation. No evidence of mitral stenosis.  5. The  aortic valve is normal in structure. Aortic valve regurgitation is not visualized. No aortic stenosis is present.  6. Prominent apical hypertrophy. FINDINGS  Left Ventricle: Left ventricular ejection fraction, by estimation, is 65 to 70%. The left ventricle has normal function. The left ventricle has no regional wall motion abnormalities. The left ventricular internal cavity size was normal in size. There is  moderate left ventricular hypertrophy. Left ventricular diastolic parameters are indeterminate. Right Ventricle: The right ventricular size is normal. No increase in right ventricular wall thickness. Right ventricular systolic function is normal. Tricuspid regurgitation signal is inadequate for assessing PA pressure. Left Atrium: Left atrial size was moderately dilated. Right Atrium: Right atrial size was normal in size. Pericardium: There is no evidence of pericardial effusion. Mitral Valve: The mitral valve is normal in structure. Trivial mitral valve regurgitation. No evidence of mitral valve stenosis. Tricuspid Valve: The tricuspid valve is  normal in structure. Tricuspid valve regurgitation is trivial. No evidence of tricuspid stenosis. Aortic Valve: The aortic valve is normal in structure. Aortic valve regurgitation is not visualized. No aortic stenosis is present. Aortic valve mean gradient measures 5.0 mmHg. Aortic valve peak gradient measures 8.8 mmHg. Aortic valve area, by VTI measures 3.13 cm. Pulmonic Valve: The pulmonic valve was normal in structure. Pulmonic valve regurgitation is not visualized. No evidence of pulmonic stenosis. Aorta: The aortic root is normal in size and structure. Venous: The inferior vena cava was not well visualized. IAS/Shunts: No atrial level shunt detected by color flow Doppler. Additional Comments: A device lead is visualized.  LEFT VENTRICLE PLAX 2D LVIDd:         4.16 cm  Diastology LVIDs:         1.93 cm  LV e' medial:    4.68 cm/s LV PW:         1.42 cm  LV E/e' medial:  10.4 LV IVS:        1.22 cm  LV e' lateral:   6.31 cm/s LVOT diam:     2.20 cm  LV E/e' lateral: 7.7 LV SV:         74 LV SV Index:   39 LVOT Area:     3.80 cm  RIGHT VENTRICLE RV Basal diam:  3.63 cm LEFT ATRIUM             Index       RIGHT ATRIUM           Index LA diam:        4.90 cm 2.56 cm/m  RA Area:     17.10 cm LA Vol (A2C):   65.6 ml 34.28 ml/m RA Volume:   47.40 ml  24.77 ml/m LA Vol (A4C):   95.6 ml 49.96 ml/m LA Biplane Vol: 87.1 ml 45.51 ml/m  AORTIC VALVE                    PULMONIC VALVE AV Area (Vmax):    2.88 cm     PV Vmax:        0.80 m/s AV Area (Vmean):   2.80 cm     PV Peak grad:   2.5 mmHg AV Area (VTI):     3.13 cm     RVOT Peak grad: 5 mmHg AV Vmax:           148.00 cm/s AV Vmean:          102.000 cm/s AV VTI:  0.237 m AV Peak Grad:      8.8 mmHg AV Mean Grad:      5.0 mmHg LVOT Vmax:         112.00 cm/s LVOT Vmean:        75.000 cm/s LVOT VTI:          0.195 m LVOT/AV VTI ratio: 0.82  AORTA Ao Root diam: 3.00 cm MITRAL VALVE               TRICUSPID VALVE MV Area (PHT): 4.68 cm    TR Peak grad:    16.8 mmHg MV Decel Time: 162 msec    TR Vmax:        205.00 cm/s MV E velocity: 48.80 cm/s MV A velocity: 48.40 cm/s  SHUNTS MV E/A ratio:  1.01        Systemic VTI:  0.20 m                            Systemic Diam: 2.20 cm Lorine Bears MD Electronically signed by Lorine Bears MD Signature Date/Time: 12/22/2020/12:19:20 PM    Final     (Echo, Carotid, EGD, Colonoscopy, ERCP)    Subjective: Patient seen and examined on the day of discharge.  Stable, no distress.  Stable for discharge home.  Discharge Exam: Vitals:   12/23/20 0415 12/23/20 0831  BP: (!) 151/99 (!) 152/96  Pulse: 60 61  Resp: 18 16  Temp: 98.1 F (36.7 C) 98.9 F (37.2 C)  SpO2: 100% 98%   Vitals:   12/22/20 1930 12/22/20 2011 12/23/20 0415 12/23/20 0831  BP: (!) 132/91 (!) 137/98 (!) 151/99 (!) 152/96  Pulse: (!) 59 60 60 61  Resp: 15 18 18 16   Temp:  98.2 F (36.8 C) 98.1 F (36.7 C) 98.9 F (37.2 C)  TempSrc:  Oral Oral Oral  SpO2: 98% 100% 100% 98%  Weight:      Height:        General: Pt is alert, awake, not in acute distress Cardiovascular: RRR, S1/S2 +, no rubs, no gallops Respiratory: CTA bilaterally, no wheezing, no rhonchi Abdominal: Soft, NT, ND, bowel sounds + Extremities: no edema, no cyanosis    The results of significant diagnostics from this hospitalization (including imaging, microbiology, ancillary and laboratory) are listed below for reference.     Microbiology: Recent Results (from the past 240 hour(s))  Resp Panel by RT-PCR (Flu A&B, Covid) Nasopharyngeal Swab     Status: None   Collection Time: 12/21/20  9:49 PM   Specimen: Nasopharyngeal Swab; Nasopharyngeal(NP) swabs in vial transport medium  Result Value Ref Range Status   SARS Coronavirus 2 by RT PCR NEGATIVE NEGATIVE Final    Comment: (NOTE) SARS-CoV-2 target nucleic acids are NOT DETECTED.  The SARS-CoV-2 RNA is generally detectable in upper respiratory specimens during the acute phase of infection. The  lowest concentration of SARS-CoV-2 viral copies this assay can detect is 138 copies/mL. A negative result does not preclude SARS-Cov-2 infection and should not be used as the sole basis for treatment or other patient management decisions. A negative result may occur with  improper specimen collection/handling, submission of specimen other than nasopharyngeal swab, presence of viral mutation(s) within the areas targeted by this assay, and inadequate number of viral copies(<138 copies/mL). A negative result must be combined with clinical observations, patient history, and epidemiological information. The expected result is Negative.  Fact Sheet for Patients:  02/20/21  Fact Sheet  for Healthcare Providers:  SeriousBroker.it  This test is no t yet approved or cleared by the [place] and  has been authorized for detection and/or diagnosis of SARS-CoV-2 by FDA under an Emergency Use Authorization (EUA). This EUA will remain  in effect (meaning this test can be used) for the duration of the COVID-19 declaration under Section 564(b)(1) of the Act, 21 U.S.C.section 360bbb-3(b)(1), unless the authorization is terminated  or revoked sooner.       Influenza A by PCR NEGATIVE NEGATIVE Final   Influenza B by PCR NEGATIVE NEGATIVE Final    Comment: (NOTE) The Xpert Xpress SARS-CoV-2/FLU/RSV plus assay is intended as an aid in the diagnosis of influenza from Nasopharyngeal swab specimens and should not be used as a sole basis for treatment. Nasal washings and aspirates are unacceptable for Xpert Xpress SARS-CoV-2/FLU/RSV testing.  Fact Sheet for Patients: BloggerCourse.com  Fact Sheet for Healthcare Providers: SeriousBroker.it  This test is not yet approved or cleared by the [place] FDA and has been authorized for detection and/or diagnosis of SARS-CoV-2 by FDA under  an Emergency Use Authorization (EUA). This EUA will remain in effect (meaning this test can be used) for the duration of the COVID-19 declaration under Section 564(b)(1) of the Act, 21 U.S.C. section 360bbb-3(b)(1), unless the authorization is terminated or revoked.  Performed at [facility], [address] [place], [place] [address]      Labs: BNP (last 3 results) Recent Labs    12/21/20 1125  BNP 786.4*   Basic Metabolic Panel: Recent Labs  Lab 12/21/20 1125 12/22/20 0950 12/23/20 1009  NA 141 140 140  K 3.1* 3.3* 4.0  CL 108 108 108  CO2 23 25 21*  GLUCOSE 156* 92 104*  BUN 18 14 16   CREATININE 1.55* 1.15* 1.32*  CALCIUM 10.6* 10.6* 11.0*   Liver Function Tests: No results for input(s): AST, ALT, ALKPHOS, BILITOT, PROT, ALBUMIN in the last 168 hours. No results for input(s): LIPASE, AMYLASE in the last 168 hours. No results for input(s): AMMONIA in the last 168 hours. CBC: Recent Labs  Lab 12/21/20 1125 12/22/20 0402 12/23/20 0508  WBC 3.7* 4.7 3.9*  NEUTROABS 1.5*  --   --   HGB 12.9 11.6* 13.3  HCT 38.7 35.1* 40.1  MCV 84.7 85.4 85.3  PLT 197 170 179   Cardiac Enzymes: No results for input(s): CKTOTAL, CKMB, CKMBINDEX, TROPONINI in the last 168 hours. BNP: Invalid input(s): POCBNP CBG: No results for input(s): GLUCAP in the last 168 hours. D-Dimer No results for input(s): DDIMER in the last 72 hours. Hgb A1c No results for input(s): HGBA1C in the last 72 hours. Lipid Profile No results for input(s): CHOL, HDL, LDLCALC, TRIG, CHOLHDL, LDLDIRECT in the last 72 hours. Thyroid function studies No results for input(s): TSH, T4TOTAL, T3FREE, THYROIDAB in the last 72 hours.  Invalid input(s): FREET3 Anemia work up No results for input(s): VITAMINB12, FOLATE, FERRITIN, TIBC, IRON, RETICCTPCT in the last 72 hours. Urinalysis No results found for: COLORURINE, APPEARANCEUR, LABSPEC, PHURINE, GLUCOSEU, HGBUR, BILIRUBINUR, KETONESUR, PROTEINUR,  UROBILINOGEN, NITRITE, LEUKOCYTESUR Sepsis Labs Invalid input(s): PROCALCITONIN,  WBC,  LACTICIDVEN Microbiology Recent Results (from the past 240 hour(s))  Resp Panel by RT-PCR (Flu A&B, Covid) Nasopharyngeal Swab     Status: None   Collection Time: 12/21/20  9:49 PM   Specimen: Nasopharyngeal Swab; Nasopharyngeal(NP) swabs in vial transport medium  Result Value Ref Range Status   SARS Coronavirus 2 by RT PCR NEGATIVE NEGATIVE Final  Comment: (NOTE) SARS-CoV-2 target nucleic acids are NOT DETECTED.  The SARS-CoV-2 RNA is generally detectable in upper respiratory specimens during the acute phase of infection. The lowest concentration of SARS-CoV-2 viral copies this assay can detect is 138 copies/mL. A negative result does not preclude SARS-Cov-2 infection and should not be used as the sole basis for treatment or other patient management decisions. A negative result may occur with  improper specimen collection/handling, submission of specimen other than nasopharyngeal swab, presence of viral mutation(s) within the areas targeted by this assay, and inadequate number of viral copies(<138 copies/mL). A negative result must be combined with clinical observations, patient history, and epidemiological information. The expected result is Negative.  Fact Sheet for Patients:  BloggerCourse.com  Fact Sheet for Healthcare Providers:  SeriousBroker.it  This test is no t yet approved or cleared by the [place] FDA and  has been authorized for detection and/or diagnosis of SARS-CoV-2 by FDA under an Emergency Use Authorization (EUA). This EUA will remain  in effect (meaning this test can be used) for the duration of the COVID-19 declaration under Section 564(b)(1) of the Act, 21 U.S.C.section 360bbb-3(b)(1), unless the authorization is terminated  or revoked sooner.       Influenza A by PCR NEGATIVE NEGATIVE Final   Influenza B by  PCR NEGATIVE NEGATIVE Final    Comment: (NOTE) The Xpert Xpress SARS-CoV-2/FLU/RSV plus assay is intended as an aid in the diagnosis of influenza from Nasopharyngeal swab specimens and should not be used as a sole basis for treatment. Nasal washings and aspirates are unacceptable for Xpert Xpress SARS-CoV-2/FLU/RSV testing.  Fact Sheet for Patients: BloggerCourse.com  Fact Sheet for Healthcare Providers: SeriousBroker.it  This test is not yet approved or cleared by the [place] FDA and has been authorized for detection and/or diagnosis of SARS-CoV-2 by FDA under an Emergency Use Authorization (EUA). This EUA will remain in effect (meaning this test can be used) for the duration of the COVID-19 declaration under Section 564(b)(1) of the Act, 21 U.S.C. section 360bbb-3(b)(1), unless the authorization is terminated or revoked.  Performed at [facility], [address] [place], [place] [address]      Time coordinating discharge: Over 30 minutes  SIGNED:   Tresa Moore, MD  [facility] 12/23/2020, 3:13 PM Pager   If 7PM-7AM, please contact night-coverage

## 2020-12-23 NOTE — Discharge Instructions (Signed)

## 2021-11-22 IMAGING — DX DG CHEST 1V PORT
1 series · 1 of 1 positions shown · non-contrast
Comparison: None.

CLINICAL DATA: Atrial fibrillation

EXAM:
PORTABLE CHEST 1 VIEW

[chest ap]
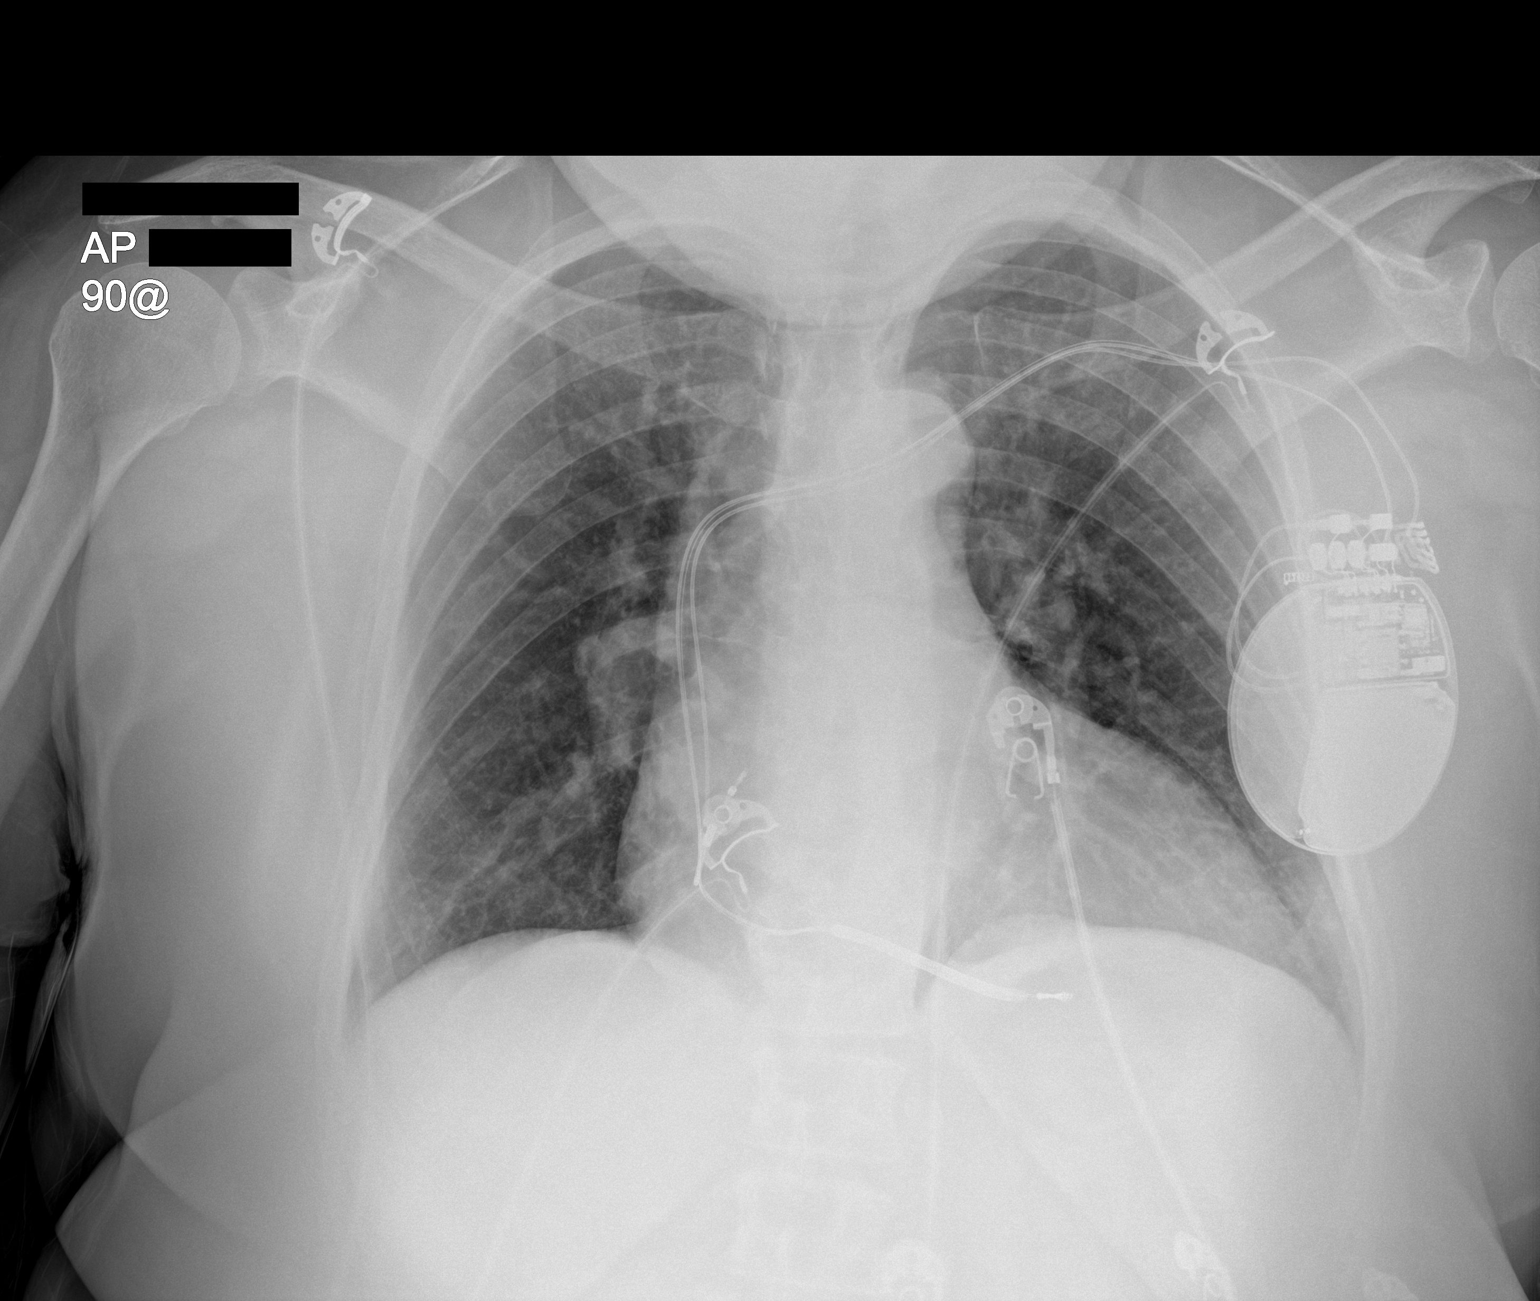

[1 of 1 positions shown; findings below may reference images not displayed]

FINDINGS: Lungs are clear. Heart is enlarged with pulmonary vascularity
normal. Pacemaker leads are attached to the right atrium and right
ventricle. No adenopathy. No pneumothorax. No bone lesions.
IMPRESSION: Cardiomegaly with pacemaker leads attached to right atrium and right
ventricle. Lungs clear.
